# Patient Record
Sex: Female | Born: 1935 | Race: White | Hispanic: No | Smoking: Never smoker
Health system: Southern US, Community
[De-identification: ages and names within clinical notes are randomized; demographics above are authoritative.]

## PROBLEM LIST (undated history)

## (undated) DIAGNOSIS — E119 Type 2 diabetes mellitus without complications: Secondary | ICD-10-CM

## (undated) DIAGNOSIS — L570 Actinic keratosis: Secondary | ICD-10-CM

## (undated) DIAGNOSIS — M797 Fibromyalgia: Secondary | ICD-10-CM

## (undated) DIAGNOSIS — N289 Disorder of kidney and ureter, unspecified: Secondary | ICD-10-CM

## (undated) DIAGNOSIS — Z8739 Personal history of other diseases of the musculoskeletal system and connective tissue: Secondary | ICD-10-CM

## (undated) DIAGNOSIS — G4700 Insomnia, unspecified: Secondary | ICD-10-CM

## (undated) DIAGNOSIS — R911 Solitary pulmonary nodule: Secondary | ICD-10-CM

## (undated) DIAGNOSIS — K219 Gastro-esophageal reflux disease without esophagitis: Secondary | ICD-10-CM

## (undated) DIAGNOSIS — M1990 Unspecified osteoarthritis, unspecified site: Secondary | ICD-10-CM

## (undated) DIAGNOSIS — K5792 Diverticulitis of intestine, part unspecified, without perforation or abscess without bleeding: Secondary | ICD-10-CM

## (undated) DIAGNOSIS — F32A Depression, unspecified: Secondary | ICD-10-CM

## (undated) DIAGNOSIS — F329 Major depressive disorder, single episode, unspecified: Secondary | ICD-10-CM

## (undated) DIAGNOSIS — K5790 Diverticulosis of intestine, part unspecified, without perforation or abscess without bleeding: Secondary | ICD-10-CM

## (undated) DIAGNOSIS — I10 Essential (primary) hypertension: Secondary | ICD-10-CM

## (undated) HISTORY — PX: SHOULDER SURGERY: SHX246

## (undated) HISTORY — DX: Fibromyalgia: M79.7

## (undated) HISTORY — DX: Gastro-esophageal reflux disease without esophagitis: K21.9

## (undated) HISTORY — PX: THYROID SURGERY: SHX805

## (undated) HISTORY — PX: APPENDECTOMY: SHX54

## (undated) HISTORY — DX: Diverticulitis of intestine, part unspecified, without perforation or abscess without bleeding: K57.92

## (undated) HISTORY — DX: Diverticulosis of intestine, part unspecified, without perforation or abscess without bleeding: K57.90

## (undated) HISTORY — DX: Insomnia, unspecified: G47.00

## (undated) HISTORY — DX: Solitary pulmonary nodule: R91.1

## (undated) HISTORY — DX: Actinic keratosis: L57.0

## (undated) HISTORY — PX: FOOT NEUROMA SURGERY: SHX646

## (undated) HISTORY — PX: CHOLECYSTECTOMY: SHX55

## (undated) HISTORY — DX: Personal history of other diseases of the musculoskeletal system and connective tissue: Z87.39

## (undated) HISTORY — PX: ABDOMINAL HYSTERECTOMY: SHX81

## (undated) HISTORY — PX: EYE SURGERY: SHX253

## (undated) HISTORY — PX: TONSILLECTOMY: SUR1361

## (undated) HISTORY — PX: BACK SURGERY: SHX140

## (undated) HISTORY — PX: KNEE SURGERY: SHX244

## (undated) HISTORY — DX: Unspecified osteoarthritis, unspecified site: M19.90

## (undated) HISTORY — DX: Type 2 diabetes mellitus without complications: E11.9

---

## 1998-10-06 ENCOUNTER — Other Ambulatory Visit: Admission: RE | Admit: 1998-10-06 | Discharge: 1998-11-06 | Payer: Self-pay | Admitting: Gynecology

## 1999-09-26 ENCOUNTER — Encounter: Admission: RE | Admit: 1999-09-26 | Discharge: 1999-09-26 | Payer: Self-pay | Admitting: Gynecology

## 1999-09-26 ENCOUNTER — Encounter: Payer: Self-pay | Admitting: Gynecology

## 1999-10-05 ENCOUNTER — Encounter: Payer: Self-pay | Admitting: Gynecology

## 1999-10-05 ENCOUNTER — Encounter: Admission: RE | Admit: 1999-10-05 | Discharge: 1999-10-05 | Payer: Self-pay | Admitting: Gynecology

## 1999-10-11 ENCOUNTER — Other Ambulatory Visit: Admission: RE | Admit: 1999-10-11 | Discharge: 1999-10-11 | Payer: Self-pay | Admitting: Gynecology

## 1999-11-01 ENCOUNTER — Ambulatory Visit (HOSPITAL_COMMUNITY): Admission: RE | Admit: 1999-11-01 | Discharge: 1999-11-01 | Payer: Self-pay | Admitting: Surgery

## 1999-11-01 ENCOUNTER — Encounter (INDEPENDENT_AMBULATORY_CARE_PROVIDER_SITE_OTHER): Payer: Self-pay

## 1999-11-01 ENCOUNTER — Encounter: Payer: Self-pay | Admitting: Surgery

## 1999-12-03 ENCOUNTER — Emergency Department (HOSPITAL_COMMUNITY): Admission: EM | Admit: 1999-12-03 | Discharge: 1999-12-03 | Payer: Self-pay | Admitting: Emergency Medicine

## 2000-02-08 ENCOUNTER — Encounter: Admission: RE | Admit: 2000-02-08 | Discharge: 2000-02-08 | Payer: Self-pay | Admitting: Neurosurgery

## 2000-02-08 ENCOUNTER — Encounter: Payer: Self-pay | Admitting: Neurosurgery

## 2000-04-16 ENCOUNTER — Encounter: Payer: Self-pay | Admitting: Neurosurgery

## 2000-04-16 ENCOUNTER — Ambulatory Visit (HOSPITAL_COMMUNITY): Admission: RE | Admit: 2000-04-16 | Discharge: 2000-04-16 | Payer: Self-pay | Admitting: Neurosurgery

## 2000-05-01 ENCOUNTER — Ambulatory Visit (HOSPITAL_COMMUNITY): Admission: RE | Admit: 2000-05-01 | Discharge: 2000-05-01 | Payer: Self-pay | Admitting: Neurosurgery

## 2000-05-01 ENCOUNTER — Encounter: Payer: Self-pay | Admitting: Neurosurgery

## 2000-08-21 ENCOUNTER — Encounter: Payer: Self-pay | Admitting: Specialist

## 2000-08-26 ENCOUNTER — Inpatient Hospital Stay (HOSPITAL_COMMUNITY): Admission: RE | Admit: 2000-08-26 | Discharge: 2000-08-28 | Payer: Self-pay | Admitting: Specialist

## 2000-12-05 ENCOUNTER — Other Ambulatory Visit: Admission: RE | Admit: 2000-12-05 | Discharge: 2000-12-05 | Payer: Self-pay | Admitting: Gynecology

## 2000-12-16 ENCOUNTER — Encounter: Payer: Self-pay | Admitting: Gynecology

## 2000-12-16 ENCOUNTER — Encounter: Admission: RE | Admit: 2000-12-16 | Discharge: 2000-12-16 | Payer: Self-pay | Admitting: Gynecology

## 2001-02-03 ENCOUNTER — Encounter: Admission: RE | Admit: 2001-02-03 | Discharge: 2001-02-03 | Payer: Self-pay | Admitting: Neurosurgery

## 2001-02-03 ENCOUNTER — Encounter: Payer: Self-pay | Admitting: Neurosurgery

## 2002-01-05 ENCOUNTER — Encounter: Payer: Self-pay | Admitting: Gynecology

## 2002-01-05 ENCOUNTER — Encounter: Admission: RE | Admit: 2002-01-05 | Discharge: 2002-01-05 | Payer: Self-pay | Admitting: Gynecology

## 2002-02-04 ENCOUNTER — Other Ambulatory Visit: Admission: RE | Admit: 2002-02-04 | Discharge: 2002-02-04 | Payer: Self-pay | Admitting: Gynecology

## 2002-10-27 ENCOUNTER — Encounter: Payer: Self-pay | Admitting: Pulmonary Disease

## 2002-10-27 ENCOUNTER — Ambulatory Visit (HOSPITAL_COMMUNITY): Admission: RE | Admit: 2002-10-27 | Discharge: 2002-10-27 | Payer: Self-pay | Admitting: Adult Health

## 2004-02-04 ENCOUNTER — Encounter: Admission: RE | Admit: 2004-02-04 | Discharge: 2004-02-04 | Payer: Self-pay | Admitting: Gynecology

## 2004-02-14 ENCOUNTER — Other Ambulatory Visit: Admission: RE | Admit: 2004-02-14 | Discharge: 2004-02-14 | Payer: Self-pay | Admitting: Gynecology

## 2004-03-21 ENCOUNTER — Ambulatory Visit (HOSPITAL_COMMUNITY): Admission: RE | Admit: 2004-03-21 | Discharge: 2004-03-21 | Payer: Self-pay | Admitting: Neurosurgery

## 2004-05-23 ENCOUNTER — Encounter: Admission: RE | Admit: 2004-05-23 | Discharge: 2004-05-23 | Payer: Self-pay | Admitting: Gynecology

## 2004-05-29 ENCOUNTER — Encounter: Admission: RE | Admit: 2004-05-29 | Discharge: 2004-05-29 | Payer: Self-pay | Admitting: Gynecology

## 2004-07-04 ENCOUNTER — Encounter: Admission: RE | Admit: 2004-07-04 | Discharge: 2004-07-04 | Payer: Self-pay | Admitting: Surgery

## 2004-07-05 ENCOUNTER — Encounter (INDEPENDENT_AMBULATORY_CARE_PROVIDER_SITE_OTHER): Payer: Self-pay | Admitting: *Deleted

## 2004-07-05 ENCOUNTER — Ambulatory Visit (HOSPITAL_BASED_OUTPATIENT_CLINIC_OR_DEPARTMENT_OTHER): Admission: RE | Admit: 2004-07-05 | Discharge: 2004-07-05 | Payer: Self-pay | Admitting: Surgery

## 2004-07-05 ENCOUNTER — Ambulatory Visit (HOSPITAL_COMMUNITY): Admission: RE | Admit: 2004-07-05 | Discharge: 2004-07-05 | Payer: Self-pay | Admitting: Surgery

## 2004-10-31 ENCOUNTER — Encounter (INDEPENDENT_AMBULATORY_CARE_PROVIDER_SITE_OTHER): Payer: Self-pay | Admitting: *Deleted

## 2004-10-31 ENCOUNTER — Inpatient Hospital Stay (HOSPITAL_COMMUNITY): Admission: RE | Admit: 2004-10-31 | Discharge: 2004-11-04 | Payer: Self-pay | Admitting: Specialist

## 2004-11-04 ENCOUNTER — Inpatient Hospital Stay
Admission: RE | Admit: 2004-11-04 | Discharge: 2004-11-09 | Payer: Self-pay | Admitting: Physical Medicine & Rehabilitation

## 2004-11-04 ENCOUNTER — Ambulatory Visit: Payer: Self-pay | Admitting: Physical Medicine & Rehabilitation

## 2005-01-13 ENCOUNTER — Emergency Department (HOSPITAL_COMMUNITY): Admission: EM | Admit: 2005-01-13 | Discharge: 2005-01-13 | Payer: Self-pay | Admitting: Emergency Medicine

## 2005-01-20 IMAGING — CR DG CHEST 2V
2 series · 2 of 2 positions shown · non-contrast
Comparison: None.

CLINICAL DATA: Preoperative respiratory evaluation. Nipple discharge.

CHEST - 2 VIEW

[view not recorded (1 of 2)]
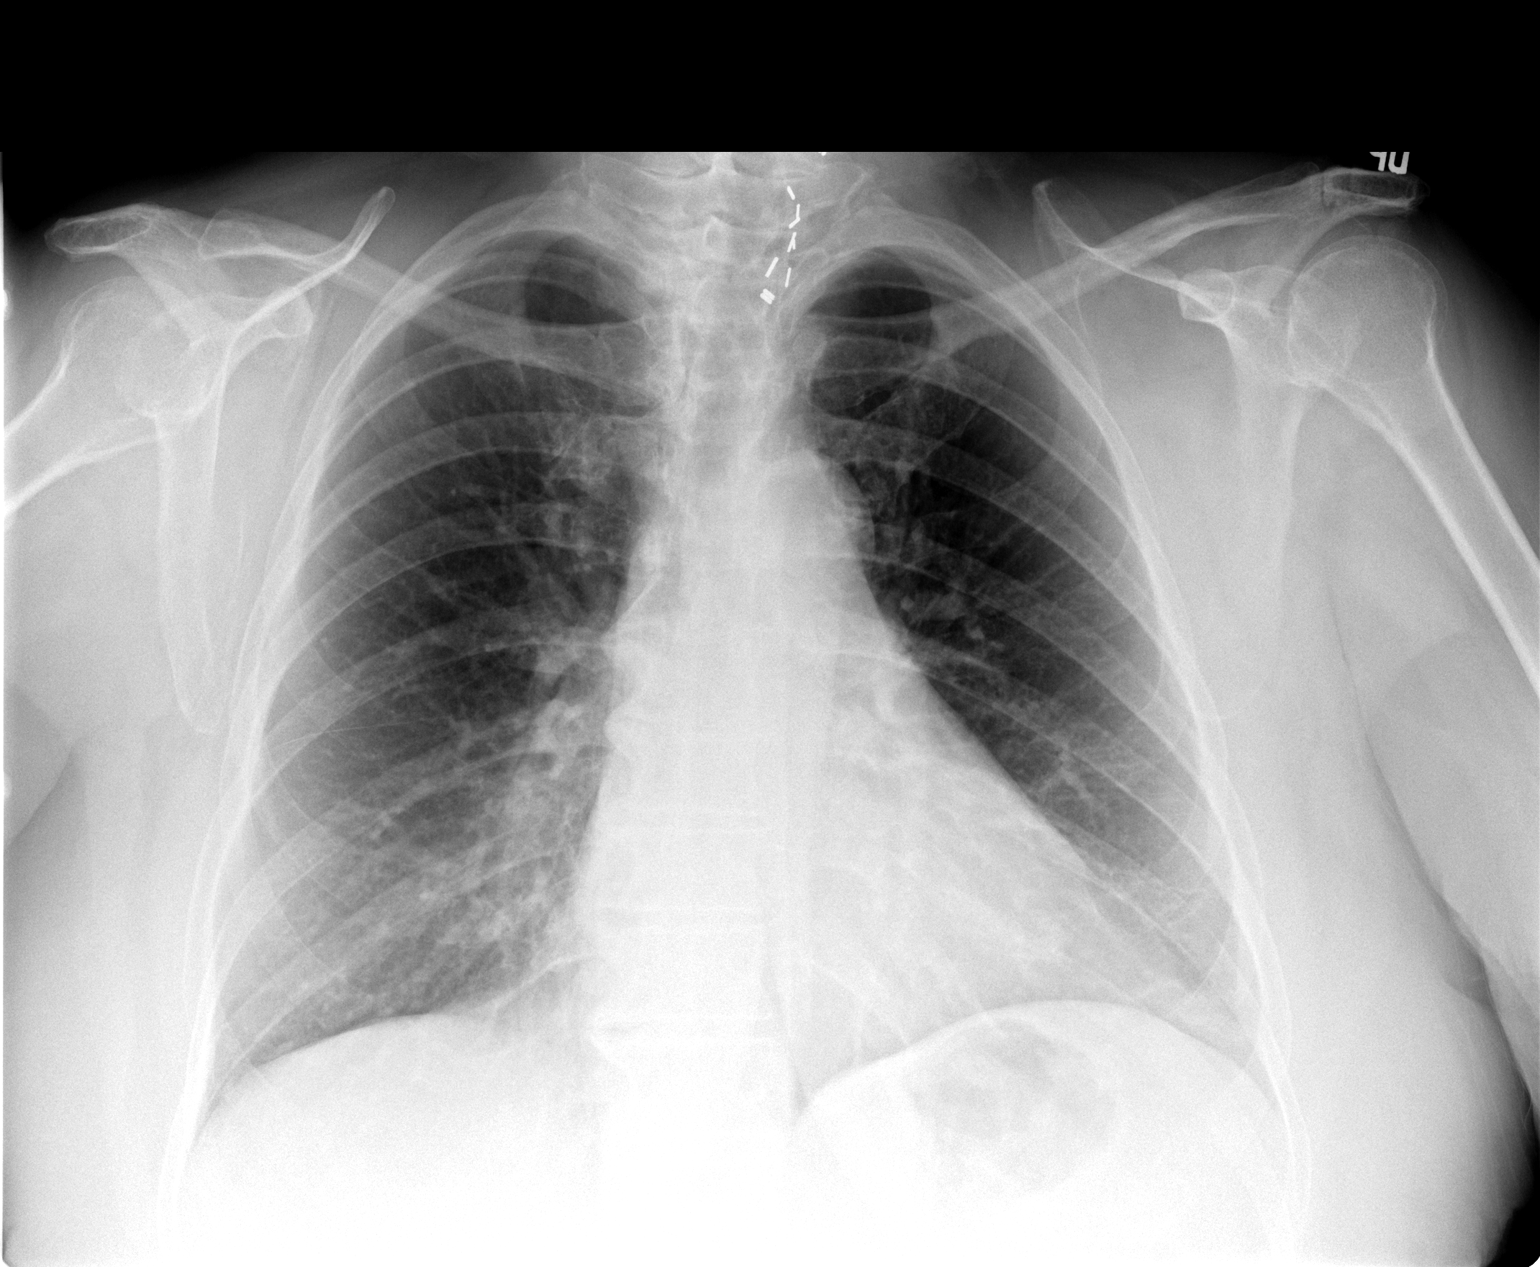

[view not recorded (2 of 2)]
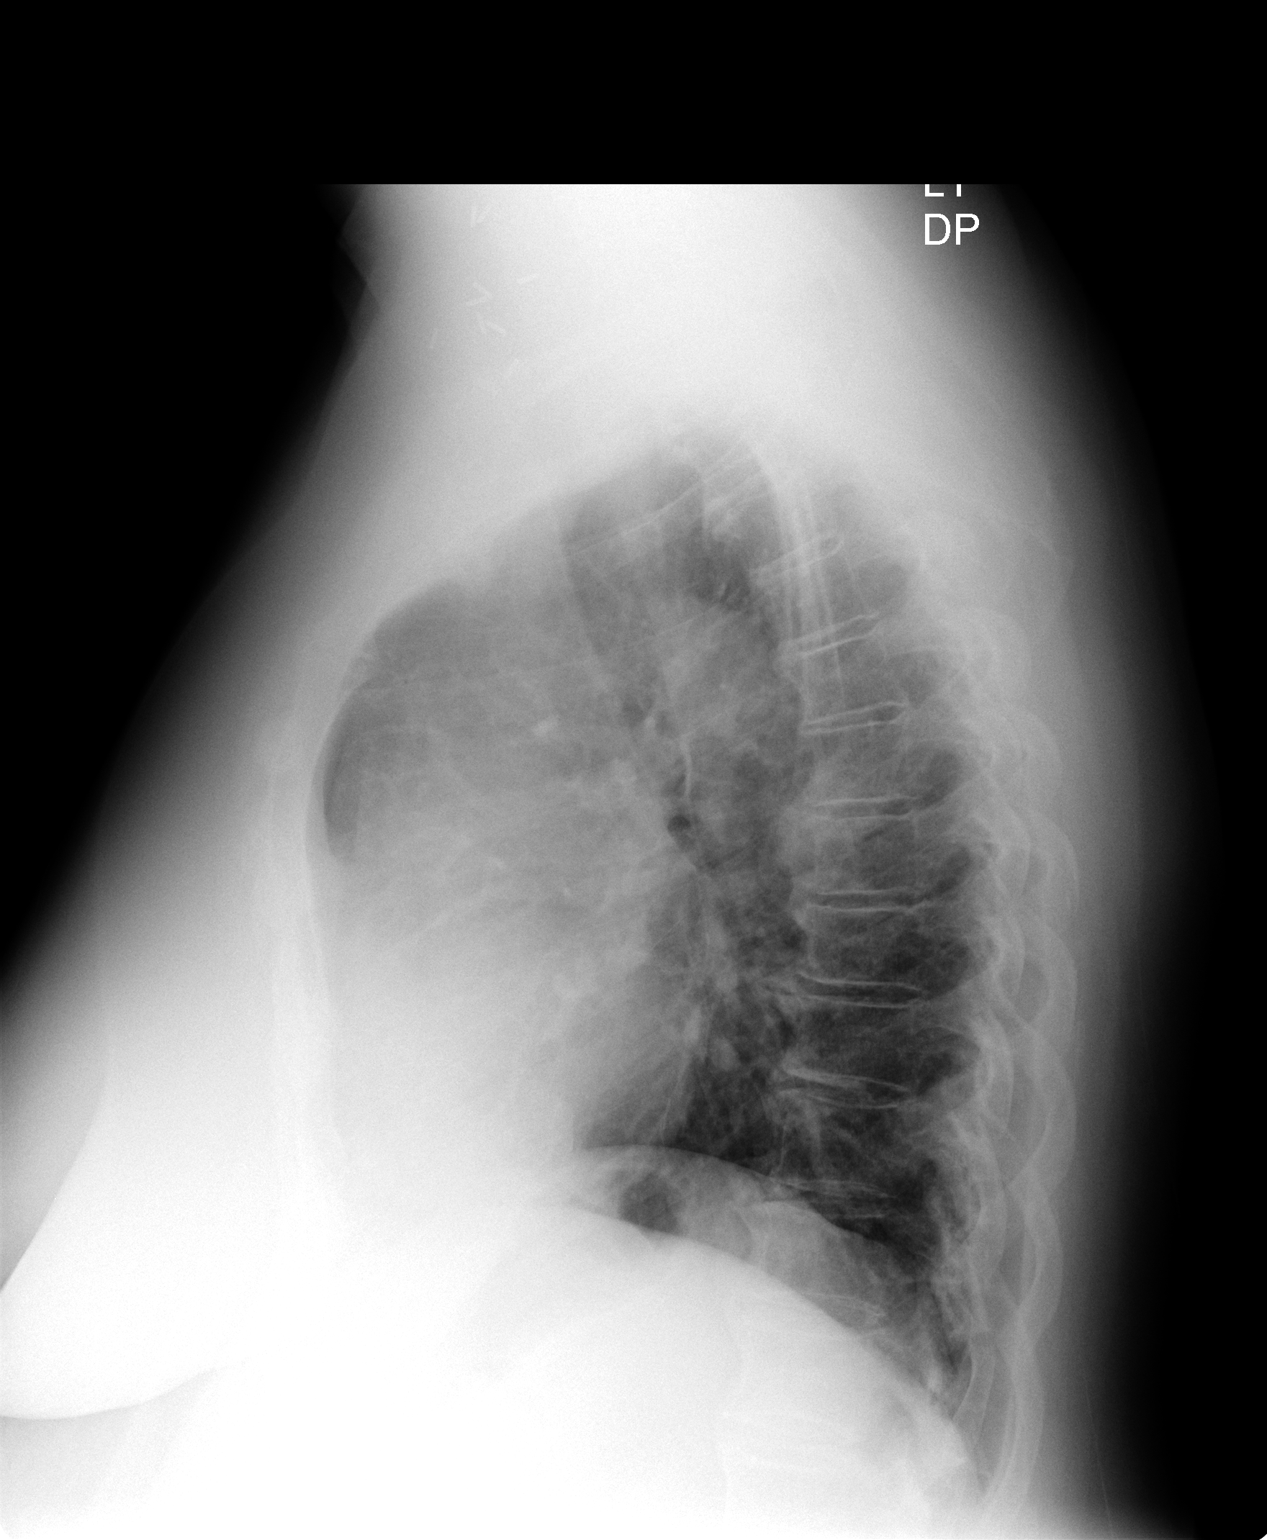

[2 of 2 positions shown; findings below may reference images not displayed]

FINDINGS: The cardiomediastinal silhouette is unremarkable for age. The lungs appear clear.
Degenerative changes are present in the thoracic spine. Surgical clips are noted in the left side
of the neck.

IMPRESSION

No evidence of acute disease.

## 2005-02-13 ENCOUNTER — Encounter: Admission: RE | Admit: 2005-02-13 | Discharge: 2005-02-13 | Payer: Self-pay | Admitting: Gynecology

## 2005-02-19 ENCOUNTER — Other Ambulatory Visit: Admission: RE | Admit: 2005-02-19 | Discharge: 2005-02-19 | Payer: Self-pay | Admitting: Gynecology

## 2005-02-22 ENCOUNTER — Encounter: Admission: RE | Admit: 2005-02-22 | Discharge: 2005-02-22 | Payer: Self-pay | Admitting: Gynecology

## 2005-08-16 ENCOUNTER — Encounter: Admission: RE | Admit: 2005-08-16 | Discharge: 2005-08-16 | Payer: Self-pay | Admitting: Gynecology

## 2005-11-13 ENCOUNTER — Ambulatory Visit: Payer: Self-pay | Admitting: Gastroenterology

## 2005-11-14 ENCOUNTER — Ambulatory Visit: Payer: Self-pay | Admitting: Gastroenterology

## 2005-11-14 ENCOUNTER — Encounter (INDEPENDENT_AMBULATORY_CARE_PROVIDER_SITE_OTHER): Payer: Self-pay | Admitting: *Deleted

## 2006-04-04 ENCOUNTER — Encounter: Admission: RE | Admit: 2006-04-04 | Discharge: 2006-04-04 | Payer: Self-pay | Admitting: Gynecology

## 2007-04-25 ENCOUNTER — Encounter: Admission: RE | Admit: 2007-04-25 | Discharge: 2007-04-25 | Payer: Self-pay | Admitting: Gynecology

## 2007-04-29 ENCOUNTER — Other Ambulatory Visit: Admission: RE | Admit: 2007-04-29 | Discharge: 2007-04-29 | Payer: Self-pay | Admitting: Gynecology

## 2007-08-10 ENCOUNTER — Emergency Department (HOSPITAL_COMMUNITY): Admission: EM | Admit: 2007-08-10 | Discharge: 2007-08-10 | Payer: Self-pay | Admitting: Family Medicine

## 2008-05-20 ENCOUNTER — Encounter: Admission: RE | Admit: 2008-05-20 | Discharge: 2008-05-20 | Payer: Self-pay | Admitting: Gynecology

## 2009-06-03 ENCOUNTER — Encounter: Admission: RE | Admit: 2009-06-03 | Discharge: 2009-06-03 | Payer: Self-pay | Admitting: Gynecology

## 2009-08-01 ENCOUNTER — Encounter: Admission: RE | Admit: 2009-08-01 | Discharge: 2009-08-01 | Payer: Self-pay | Admitting: Internal Medicine

## 2009-11-01 ENCOUNTER — Telehealth: Payer: Self-pay | Admitting: Gastroenterology

## 2009-11-02 DIAGNOSIS — I10 Essential (primary) hypertension: Secondary | ICD-10-CM | POA: Insufficient documentation

## 2009-11-02 DIAGNOSIS — F329 Major depressive disorder, single episode, unspecified: Secondary | ICD-10-CM

## 2009-11-02 DIAGNOSIS — F3289 Other specified depressive episodes: Secondary | ICD-10-CM | POA: Insufficient documentation

## 2009-11-02 DIAGNOSIS — K5732 Diverticulitis of large intestine without perforation or abscess without bleeding: Secondary | ICD-10-CM

## 2009-11-02 DIAGNOSIS — Z8711 Personal history of peptic ulcer disease: Secondary | ICD-10-CM

## 2009-11-02 DIAGNOSIS — K589 Irritable bowel syndrome without diarrhea: Secondary | ICD-10-CM

## 2009-11-02 DIAGNOSIS — E785 Hyperlipidemia, unspecified: Secondary | ICD-10-CM

## 2009-11-02 DIAGNOSIS — K219 Gastro-esophageal reflux disease without esophagitis: Secondary | ICD-10-CM | POA: Insufficient documentation

## 2009-11-03 ENCOUNTER — Ambulatory Visit: Payer: Self-pay | Admitting: Gastroenterology

## 2009-11-03 ENCOUNTER — Encounter (INDEPENDENT_AMBULATORY_CARE_PROVIDER_SITE_OTHER): Payer: Self-pay | Admitting: *Deleted

## 2009-11-03 DIAGNOSIS — R1032 Left lower quadrant pain: Secondary | ICD-10-CM

## 2009-11-03 DIAGNOSIS — R195 Other fecal abnormalities: Secondary | ICD-10-CM

## 2009-11-03 LAB — CONVERTED CEMR LAB
ALT: 27 units/L (ref 0–35)
Alkaline Phosphatase: 73 units/L (ref 39–117)
Basophils Absolute: 0.1 10*3/uL (ref 0.0–0.1)
CRP, High Sensitivity: 65 — ABNORMAL HIGH (ref 0.00–5.00)
Calcium: 9.9 mg/dL (ref 8.4–10.5)
Chloride: 102 meq/L (ref 96–112)
Creatinine, Ser: 0.8 mg/dL (ref 0.4–1.2)
Eosinophils Absolute: 0.1 10*3/uL (ref 0.0–0.7)
GFR calc non Af Amer: 73.29 mL/min (ref >60)
HCT: 43.1 % (ref 36.0–46.0)
Hemoglobin: 13.9 g/dL (ref 12.0–15.0)
Lymphocytes Relative: 29.1 % (ref 12.0–46.0)
MCHC: 32.3 g/dL (ref 30.0–36.0)
MCV: 97.5 fL (ref 78.0–100.0)
Neutro Abs: 2.9 10*3/uL (ref 1.4–7.7)
Platelets: 210 10*3/uL (ref 150.0–400.0)
RBC: 4.42 M/uL (ref 3.87–5.11)
Sed Rate: 63 mm/hr — ABNORMAL HIGH (ref 0–22)
Sodium: 138 meq/L (ref 135–145)
Total Bilirubin: 0.5 mg/dL (ref 0.3–1.2)
Total Protein: 8.1 g/dL (ref 6.0–8.3)
Transferrin: 220.3 mg/dL (ref 212.0–360.0)
Vitamin B-12: 383 pg/mL (ref 211–911)

## 2009-11-09 ENCOUNTER — Ambulatory Visit: Payer: Self-pay | Admitting: Gastroenterology

## 2009-11-24 ENCOUNTER — Ambulatory Visit: Payer: Self-pay | Admitting: Gastroenterology

## 2009-12-26 ENCOUNTER — Ambulatory Visit: Payer: Self-pay | Admitting: Gastroenterology

## 2009-12-27 ENCOUNTER — Telehealth: Payer: Self-pay | Admitting: Gastroenterology

## 2009-12-27 ENCOUNTER — Encounter: Payer: Self-pay | Admitting: Gastroenterology

## 2009-12-27 LAB — CONVERTED CEMR LAB
Basophils Absolute: 0.1 10*3/uL (ref 0.0–0.1)
Basophils Relative: 0.8 % (ref 0.0–3.0)
Eosinophils Absolute: 0.1 10*3/uL (ref 0.0–0.7)
Eosinophils Relative: 0.9 % (ref 0.0–5.0)
HCT: 41.2 % (ref 36.0–46.0)
Hemoglobin: 13.5 g/dL (ref 12.0–15.0)
Lymphs Abs: 1.6 10*3/uL (ref 0.7–4.0)
MCV: 96.9 fL (ref 78.0–100.0)
Neutro Abs: 7.8 10*3/uL — ABNORMAL HIGH (ref 1.4–7.7)
WBC: 10.5 10*3/uL (ref 4.5–10.5)

## 2010-01-03 ENCOUNTER — Ambulatory Visit: Payer: Self-pay | Admitting: Gastroenterology

## 2010-01-03 DIAGNOSIS — K559 Vascular disorder of intestine, unspecified: Secondary | ICD-10-CM | POA: Insufficient documentation

## 2010-01-03 LAB — CONVERTED CEMR LAB
ALT: 28 units/L (ref 0–35)
Basophils Absolute: 0 10*3/uL (ref 0.0–0.1)
Creatinine, Ser: 0.6 mg/dL (ref 0.4–1.2)
Eosinophils Absolute: 0.1 10*3/uL (ref 0.0–0.7)
Folate: 19.4 ng/mL
GFR calc non Af Amer: 102.11 mL/min (ref >60)
HCT: 41 % (ref 36.0–46.0)
Hemoglobin: 13.4 g/dL (ref 12.0–15.0)
Lymphs Abs: 1.4 10*3/uL (ref 0.7–4.0)
MCHC: 32.5 g/dL (ref 30.0–36.0)
Monocytes Absolute: 0.4 10*3/uL (ref 0.1–1.0)
Neutro Abs: 4.3 10*3/uL (ref 1.4–7.7)
Potassium: 4.2 meq/L (ref 3.5–5.1)
RBC: 4.23 M/uL (ref 3.87–5.11)
TSH: 1.18 microintl units/mL (ref 0.35–5.50)
Total Bilirubin: 0.4 mg/dL (ref 0.3–1.2)
Transferrin: 196.9 mg/dL — ABNORMAL LOW (ref 212.0–360.0)
Vitamin B-12: 428 pg/mL (ref 211–911)
WBC: 6.2 10*3/uL (ref 4.5–10.5)

## 2010-01-24 ENCOUNTER — Ambulatory Visit: Payer: Self-pay | Admitting: Gastroenterology

## 2010-01-24 DIAGNOSIS — K5289 Other specified noninfective gastroenteritis and colitis: Secondary | ICD-10-CM

## 2010-02-20 ENCOUNTER — Telehealth: Payer: Self-pay | Admitting: Gastroenterology

## 2010-03-23 ENCOUNTER — Encounter: Admission: RE | Admit: 2010-03-23 | Discharge: 2010-03-23 | Payer: Self-pay | Admitting: Internal Medicine

## 2010-08-08 ENCOUNTER — Encounter: Admission: RE | Admit: 2010-08-08 | Discharge: 2010-08-08 | Payer: Self-pay | Admitting: Internal Medicine

## 2010-10-30 ENCOUNTER — Encounter: Payer: Self-pay | Admitting: Gynecology

## 2010-11-07 NOTE — Procedures (Signed)
Summary: Colon   Colonoscopy  Procedure date:  11/14/2005  Findings:      Location:  [facility].    Colonoscopy  Procedure date:  11/14/2005  Findings:      Location:  [facility].   Patient Name: Kristen Wagner, Naff MRN:  Procedure Procedures: Colonoscopy CPT: [id number].    with biopsy. CPT: Q5068410.  Personnel: Endoscopist: Vania Rea. Jarold Motto, MD.  Exam Location: Exam performed in Outpatient Clinic. Outpatient  Patient Consent: Procedure, Alternatives, Risks and Benefits discussed, consent obtained, from patient. Consent was obtained by the RN.  Indications Symptoms: Hematochezia.  History  Current Medications: Patient is not currently taking Coumadin.  Pre-Exam Physical: Performed Nov 14, 2005. Cardio-pulmonary exam, Rectal exam, Abdominal exam, Extremity exam, Mental status exam WNL.  Comments: Pt. history reviewed/updated, physical exam performed prior to initiation of sedation? Exam Exam: Extent of exam reached: Cecum, extent intended: Cecum.  The cecum was identified by appendiceal orifice and IC valve. Patient position: on left side. Duration of exam: 20 minutes. Colon retroflexion performed. Images taken. ASA Classification: II. Tolerance: excellent.  Monitoring: Pulse and BP monitoring, Oximetry used. Supplemental O2 given. at 2 Liters.  Colon Prep Used Golytely for colon prep. Prep results: excellent.  Sedation Meds: Patient assessed and found to be appropriate for moderate (conscious) sedation. Fentanyl 75 mcg. given IV. Versed 6 mg. given IV.  Instrument(s): CF 140L. Serial [id number].  Findings ISCHEMIC COLITIS: Transverse Colon to Descending Colon. Erythema present. Vascular pattern absent. Erosions absent, Pseudo polyps absent, Granularity present, bleeding absent, ulcers absent, Haustral folds diminished. Activity level inactive, Biopsy/Mucosal Abn. taken. ICD9: Colitis, Ischemic: 557.9. Comments: Scarring present  but no activity...  - DIVERTICULOSIS: Descending Colon to Sigmoid Colon. Not bleeding. ICD9: Diverticulosis, Colon: 562.10.  - NORMAL EXAM: Cecum to Rectum. Not Seen: Polyps. AVM's. Colitis. Tumors. Crohn's. Diverticulosis.   Assessment  Diagnoses: 557.9: Colitis, Ischemic.  562.10: Diverticulosis, Colon.   Events  Unplanned Interventions: No intervention was required.  Plans Medication Plan: Await pathology. Continue current medications.  Patient Education: Patient given standard instructions for: Diverticulosis.  Disposition: After procedure patient sent to recovery. After recovery patient sent home.  Scheduling/Referral: Follow-Up prn. Await pathology to schedule patient.    cc: Saul Fordyce ANP  This report was created from the original endoscopy report, which was reviewed and signed by the above listed endoscopist.

## 2010-11-07 NOTE — Assessment & Plan Note (Signed)
Summary: 2 WEEK F/U   History of Present Illness Visit Type: Follow-up Visit Primary GI MD: Sheryn Bison MD FACP FAGA Primary Provider: Baltazar Najjar, MD Requesting Provider: n/a Chief Complaint: Two week f/u for diarrhea.  Pt states that the diarrhea is better and she denies any GI complaints  History of Present Illness:   Her chronic diarrhea is completely resolved on Entocort 9 mg a day. She denies other gastrointestinal issues. She been unable to afford her medication and this is been supplied by some samples that we provided. She is on multiple other medications listed and reviewed her chart today. She continues to complain of chronic insomnia and apparently her insurance covered it will not pay for her Ambien CR 6.25 mg a day.   GI Review of Systems      Denies abdominal pain, acid reflux, belching, bloating, chest pain, dysphagia with liquids, dysphagia with solids, heartburn, loss of appetite, nausea, vomiting, vomiting blood, weight loss, and  weight gain.        Denies anal fissure, black tarry stools, change in bowel habit, constipation, diarrhea, diverticulosis, fecal incontinence, heme positive stool, hemorrhoids, irritable bowel syndrome, jaundice, light color stool, liver problems, rectal bleeding, and  rectal pain.    Current Medications (verified): 1)  Nexium 40 Mg Cpdr (Esomeprazole Magnesium) .... One Tablet By Mouth Once Daily 2)  Amlodipine Besylate 5 Mg Tabs (Amlodipine Besylate) .... One Tablet By Mouth Once Daily 3)  Quinapril Hcl 20 Mg Tabs (Quinapril Hcl) .... One Tablet By Mouth Once Daily 4)  Fexofenadine Hcl 60 Mg Tabs (Fexofenadine Hcl) .... One Tablet By Mouth Once Daily 5)  Ambien Cr 6.25 Mg Cr-Tabs (Zolpidem Tartrate) .... One Tablet By Mouth At Bedtime 6)  Flax Seed Oil 1000 Mg Caps (Flaxseed (Linseed)) .... 2 Capsules By Mouth Once Daily 7)  Biotin 1000 Mcg Tabs (Biotin) .... One Tablet By Mouth Once Daily 8)  Vitamin D3 5000 Unit/ml Liqd  (Cholecalciferol) .... One Tablet By Mouth Once Daily 9)  Ester-C 500-60 Mg Tabs (Vitamin Mixture) .... One Tablet By Mouth Once Daily 10)  Multivitamins   Tabs (Multiple Vitamin) .... One Tablet By Mouth Once Daily 11)  Calcium-Magnesium-Zinc 333-133-8.3 Mg Tabs (Calcium-Magnesium-Zinc) .... One Tablet By Mouth Once Daily 12)  Tramadol Hcl 50 Mg Tabs (Tramadol Hcl) .Marland Kitchen.. 1 By Mouth Q 6 Hrs As Needed Pain 13)  Lexapro 10 Mg Tabs (Escitalopram Oxalate) .... Once Daily 14)  Entocort Ec 3 Mg  Cp24 (Budesonide) .... Take 3 Capsules Daily  Allergies (verified): 1)  ! Prednisone  Past History:  Past medical, surgical, family and social histories (including risk factors) reviewed for relevance to current acute and chronic problems.  Past Medical History: Reviewed history from 11/02/2009 and no changes required. Current Problems:  ACID REFLUX DISEASE (ICD-530.81) GASTRIC ULCER, HX OF (ICD-V12.71) DEPRESSION (ICD-311) HYPERLIPIDEMIA (ICD-272.4) HYPERTENSION (ICD-401.9) DIVERTICULITIS, COLON (ICD-562.11) IRRITABLE BOWEL SYNDROME (ICD-564.1)  Past Surgical History: Reviewed history from 11/02/2009 and no changes required. Appendectomy Breast Biopsy Cholecystectomy Hysterectomy  Family History: Reviewed history from 11/03/2009 and no changes required. No FH of Colon Cancer: Family History of Diabetes: Mother  Social History: Reviewed history from 11/03/2009 and no changes required. Widowed Patient has never smoked.  Alcohol Use - yes Illicit Drug Use - no  Review of Systems  The patient denies allergy/sinus, anemia, anxiety-new, arthritis/joint pain, back pain, blood in urine, breast changes/lumps, change in vision, confusion, cough, coughing up blood, depression-new, fainting, fatigue, fever, headaches-new, hearing problems, heart murmur, heart rhythm  changes, itching, menstrual pain, muscle pains/cramps, night sweats, nosebleeds, pregnancy symptoms, shortness of breath, skin rash,  sleeping problems, sore throat, swelling of feet/legs, swollen lymph glands, thirst - excessive , urination - excessive , urination changes/pain, urine leakage, vision changes, and voice change.    Vital Signs:  Patient profile:   77 year old female Height:      64 inches Weight:      181 pounds BMI:     31.18 Pulse rate:   70 / minute Pulse rhythm:   regular BP sitting:   144 / 76  (right arm) Cuff size:   regular  Vitals Entered By: Christie Nottingham CMA Duncan Dull) (January 24, 2010 3:13 PM)  Physical Exam  General:  Well developed, well nourished, no acute distress.healthy appearing.   Head:  Normocephalic and atraumatic. Eyes:  PERRLA, no icterus.exam deferred to patient's ophthalmologist.   Psych:  Alert and cooperative. Normal mood and affect.   Impression & Recommendations:  Problem # 1:  OTH&UNSPEC NONINFECTIOUS GASTROENTERITIS&COLITIS (ICD-558.9) Assessment Improved Therapeutic options are limited her repeated refusal to get medications because of expense. I have given her enough Entocort to taper off of this over the next 2 weeks. She has a relapse, this issue will be readdressed.  Problem # 2:  DEPRESSION (ICD-311) Assessment: Improved Trial of regular generic Ambien 10 mg at bedtime as tolerated. In the future this will need to be refilled by her primary care physician. It is of note she takes Lexapro 10 mg a day.  Patient Instructions: 1)  Decrease entocort to two tabs daily for one week.  Then take on tab daily for one week. Then stop entocort. 2)  Call Dr. Jarold Motto if your symptoms return. 3)  Begin Ambien for sleep. 4)  The medication list was reviewed and reconciled.  All changed / newly prescribed medications were explained.  A complete medication list was provided to the patient / caregiver. 5)  Copy sent to : Dr. Kirtland Bouchard  Appended Document: 2 WEEK F/U    Clinical Lists Changes  Medications: Changed medication from ENTOCORT EC 3 MG  CP24 (BUDESONIDE)  Take 3 capsules daily to ENTOCORT EC 3 MG  CP24 (BUDESONIDE) Decrease to 2 tabs daily for one week then 1 tab daily for one week then d/c. Changed medication from AMBIEN CR 6.25 MG CR-TABS (ZOLPIDEM TARTRATE) one tablet by mouth at bedtime to AMBIEN 10 MG TABS (ZOLPIDEM TARTRATE) 1 q hs as needed - Signed Rx of AMBIEN 10 MG TABS (ZOLPIDEM TARTRATE) 1 q hs as needed;  #30 x 0;  Signed;  Entered by: Ashok Cordia RN;  Authorized by: Mardella Layman MD [facility];  Method used: Printed then faxed to [facility]*, [address], [place], [place]  [address], Ph: [phone] or [phone], Fax: [phone]    Prescriptions: AMBIEN 10 MG TABS (ZOLPIDEM TARTRATE) 1 q hs as needed  #30 x 0   Entered by:   Ashok Cordia RN   Authorized by:   Mardella Layman MD [facility]   Signed by:   Ashok Cordia RN on 01/24/2010   Method used:   Printed then faxed to ...       [facility]* (retail)       [address] [place], [place]  [address]       Ph: [phone]  or [phone]       Fax: [phone]   RxID:   [id number]

## 2010-11-07 NOTE — Progress Notes (Signed)
Summary: Lialda not covered  Phone Note Outgoing Call   Summary of Call: Insurance has denied prior authorization for lialda. Pt needs to try all othe covered meds such as: Asacol, sulfasalazine, balasalazide and pentasa.  OR the prescriber must provide specific medical reasons why these meds are not appropriate to treat pt.  Pt is willing to try another med if indicated. Initial call taken by: Ashok Cordia RN,  December 27, 2009 3:46 PM  Follow-up for Phone Call        asacol 800 mg two times a day is ok Follow-up by: Mardella Layman MD [name],  December 27, 2009 4:01 PM  Additional Follow-up for Phone Call Additional follow up Details #1::        Pt notified.  Rx sent to pharmacy. Additional Follow-up by: Ashok Cordia RN,  December 28, 2009 9:09 AM    New/Updated Medications: ASACOL HD 800 MG TBEC (MESALAMINE) 1 by mouth two times a day Prescriptions: ASACOL HD 800 MG TBEC (MESALAMINE) 1 by mouth two times a day  #60 x 6   Entered by:   Ashok Cordia RN   Authorized by:   Mardella Layman MD [facility]   Signed by:   Ashok Cordia RN on 12/28/2009   Method used:   Electronically to        [facility]* (retail)       [address] [place], [place]  [address]       Ph: [phone] or [phone]       Fax: [phone]   RxID:   [id number]

## 2010-11-07 NOTE — Miscellaneous (Signed)
Summary: Samples of Lialda  Clinical Lists Changes  Medications: Changed medication from LIALDA 1.2 GM  TBEC (MESALAMINE) 2 tabs by mouth qd to LIALDA 1.2 GM  TBEC (MESALAMINE) 2 tabs by mouth qd

## 2010-11-07 NOTE — Assessment & Plan Note (Signed)
Summary: F/U APPT...LSW.   History of Present Illness Visit Type: Follow-up Visit Primary GI MD: Sheryn Bison MD FACP FAGA Primary Provider: Baltazar Najjar, MD Requesting Provider: n/a Chief Complaint: F/u for IBS and Diverticulitis. 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 However, she had not responded to p.o.amino salicylate therapy and continued abdominal cramping and diarrhea. She takes daily Nexium for acid reflux the lower broad of other medications again listed and reviewed her chart. She denies systemic complaints except for diffuse arthralgias with suspected polymyalgia rheumatica with intermittent courses of prednisone therapy. She denies any food intolerances or use of p.o. sorbitol or fructose.   GI Review of Systems      Denies abdominal pain, acid reflux, belching, bloating, chest pain, dysphagia with liquids, dysphagia with solids, heartburn, loss of appetite, nausea, vomiting, vomiting blood, weight loss, and  weight gain.      Reports diarrhea, diverticulosis, and  irritable bowel syndrome.     Denies anal fissure, black tarry stools, change in bowel habit, constipation, fecal incontinence, heme positive stool, hemorrhoids, jaundice, light color stool, liver problems, rectal bleeding, and  rectal pain.    Current Medications (verified): 1)  Nexium 40 Mg Cpdr (Esomeprazole Magnesium) .... One Tablet By Mouth Once Daily 2)  Amlodipine Besylate 5 Mg Tabs (Amlodipine Besylate) .... One Tablet By Mouth Once Daily 3)  Quinapril Hcl 20 Mg Tabs (Quinapril Hcl) .... One Tablet By Mouth Once Daily 4)  Fexofenadine Hcl 60 Mg Tabs (Fexofenadine Hcl) .... One Tablet By Mouth Once Daily 5)  Ambien Cr 6.25 Mg Cr-Tabs (Zolpidem Tartrate) .... One Tablet By Mouth At Bedtime 6)   Flax Seed Oil 1000 Mg Caps (Flaxseed (Linseed)) .... 2 Capsules By Mouth Once Daily 7)  Biotin 1000 Mcg Tabs (Biotin) .... One Tablet By Mouth Once Daily 8)  Vitamin D3 5000 Unit/ml Liqd (Cholecalciferol) .... One Tablet By Mouth Once Daily 9)  Ester-C 500-60 Mg Tabs (Vitamin Mixture) .... One Tablet By Mouth Once Daily 10)  Multivitamins   Tabs (Multiple Vitamin) .... One Tablet By Mouth Once Daily 11)  Calcium-Magnesium-Zinc 333-133-8.3 Mg Tabs (Calcium-Magnesium-Zinc) .... One Tablet By Mouth Once Daily 12)  Asacol Hd 800 Mg Tbec (Mesalamine) .Marland Kitchen.. 1 By Mouth Two Times A Day 13)  Tramadol Hcl 50 Mg Tabs (Tramadol Hcl) .Marland Kitchen.. 1 By Mouth Q 6 Hrs As Needed Pain 14)  Lexapro 10 Mg Tabs (Escitalopram Oxalate) .... Once Daily  Allergies (verified): 1)  ! Prednisone  Past History:  Past medical, surgical, family and social histories (including risk factors) reviewed for relevance to current acute and chronic problems.  Past Medical History: Reviewed history from 11/02/2009 and no changes required. Current Problems:  ACID REFLUX DISEASE (ICD-530.81) GASTRIC ULCER, HX OF (ICD-V12.71) DEPRESSION (ICD-311) HYPERLIPIDEMIA (ICD-272.4) HYPERTENSION (ICD-401.9) DIVERTICULITIS, COLON (ICD-562.11) IRRITABLE BOWEL SYNDROME (ICD-564.1)  Past Surgical History: Reviewed history from 11/02/2009 and no changes required. Appendectomy Breast Biopsy Cholecystectomy Hysterectomy  Family History: Reviewed history from 11/03/2009 and no changes required. No FH of Colon Cancer: Family History of Diabetes: Mother  Social History: Reviewed history from 11/03/2009 and no changes required. Widowed Patient has never smoked.  Alcohol Use - yes Illicit Drug Use - no  Review of Systems       The patient complains of arthritis/joint pain, back pain, fatigue, and sleeping problems.  The patient denies allergy/sinus, anemia, anxiety-new, blood in urine, breast changes/lumps, change in vision, confusion,  cough, coughing up blood, depression-new, fainting, fever, headaches-new, hearing problems, heart murmur, heart rhythm changes, itching, menstrual pain, muscle pains/cramps, night sweats, nosebleeds, pregnancy symptoms, shortness of breath, skin rash, sore throat, swelling of feet/legs, swollen lymph glands, thirst - excessive , urination - excessive , urination changes/pain, urine leakage, vision changes, and voice change.    Vital Signs:  Patient profile:   77 year old female Height:      64 inches Weight:      182 pounds BMI:     31.35 BSA:     1.88 Pulse rate:   72 / minute Pulse rhythm:   regular BP sitting:   136 / 82  (left arm) Cuff size:   regular  Vitals Entered By: Ok Anis CMA (January 03, 2010 1:26 PM)  Physical Exam  General:  Well developed, well nourished, no acute distress.healthy appearing and obese.   Head:  Normocephalic and atraumatic. Eyes:  PERRLA, no icterus.exam deferred to patient's ophthalmologist.   Abdomen:  Soft, nontender and nondistended. No masses, hepatosplenomegaly or hernias noted. Normal bowel sounds. Neurologic:  Alert and  oriented x4;  grossly normal neurologically. Psych:  Alert and cooperative. Normal mood and affect.   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 I have decided to treat her with Entocort 9 mg a day with office followup in several weeks' time. P.o.amino salicylates have been discontinued. I also placed her on low fiber diet as tolerated and will repeat her labs. Orders: TLB-BMP (Basic Metabolic Panel-BMET) (80048-METABOL) TLB-CBC Platelet - w/Differential (85025-CBCD) TLB-Hepatic/Liver Function Pnl (80076-HEPATIC) TLB-TSH (Thyroid Stimulating Hormone) (84443-TSH) TLB-B12, Serum-Total ONLY (13086-V78) TLB-Ferritin (82728-FER) TLB-Folic Acid (Folate) (82746-FOL) TLB-IBC Pnl  (Iron/FE;Transferrin) (83550-IBC) TLB-Magnesium (Mg) (83735-MG) TLB-IgA (Immunoglobulin A) (82784-IGA) T-Sprue Panel (Celiac Disease Aby Eval) (83516x3/[address])  Problem # 2:  ABDOMINAL PAIN, LEFT LOWER QUADRANT (ICD-789.04) Assessment: Unchanged  Problem # 3:  GASTRIC ULCER, HX OF (ICD-V12.71) Assessment: Improved Continued daily PPI therapy.  Patient Instructions: 1)  Begin Entocort 3 tabs each morning.  2)  Stop Lialda. 3)  Go to  the basement for lab work. 4)  Please schedule a follow-up appointment in 2 weeks.  5)  The medication list was reviewed and reconciled.  All changed / newly prescribed medications were explained.  A complete medication list was provided to the patient / caregiver. 6)  Copy sent to : Dr. Baltazar Najjar 7)  Please schedule a follow-up appointment in 2 weeks.  8)  Advised to stick with a low residue diet  avoiding food that can irritate bowel (see handout).

## 2010-11-07 NOTE — Letter (Signed)
Summary: [facility]  [address] [place], [place] [address]   Phone: [phone]  Fax: [phone]       ANDRE GALLEGO    10-Dec-1926    MRN: [id number]        Procedure Day Dorna Bloom: Wednesday, 11/09/09     Arrival Time: 3:00      Procedure Time: 4:00     Location of Procedure:                    _X _  [facility] (4th Floor)    PREPARATION FOR FLEXIBLE SIGMOIDOSCOPY WITH MAGNESIUM CITRATE  Prior to the day before your procedure, purchase one 8 oz. bottle of Magnesium Citrate and one Fleet Enema from the laxative section of your drugstore.  _________________________________________________________________________________________________  THE DAY BEFORE YOUR PROCEDURE             DATE: 11/08/09     DAY: Tuesday  1.   Have a clear liquid dinner the night before your procedure.  2.   Do not drink anything colored red or purple.  Avoid juices with pulp.  No orange juice.              CLEAR LIQUIDS INCLUDE: Water Jello Ice Popsicles Tea (sugar ok, no milk/cream) Powdered fruit flavored drinks Coffee (sugar ok, no milk/cream) Gatorade Juice: apple, white grape, white cranberry  Lemonade Clear bullion, consomm, broth Carbonated beverages (any kind) Strained chicken noodle soup Hard Candy   3.   At 7:00 pm the night before your procedure, drink one bottle of Magnesium Citrate over ice.  4.   Drink at least 3 more glasses of clear liquids before bedtime (preferably juices).  5.   Results are expected usually within 1 to 6 hours after taking the Magnesium Citrate.  ___________________________________________________________________________________________________  THE DAY OF YOUR PROCEDURE            DATE: 11/09/09     DAY: Wednesday  1.   Use Fleet Enema one hour prior to coming for procedure.  2.   You may drink clear liquids until 2:00 (2 hours before exam)       MEDICATION INSTRUCTIONS  Unless otherwise  instructed, you should take regular prescription medications with a small sip of water as early as possible the morning of your procedure.                OTHER INSTRUCTIONS  You will need a responsible adult at least 75 years of age to accompany you and drive you home.   This person must remain in the waiting room during your procedure.  Wear loose fitting clothing that is easily removed.  Leave jewelry and other valuables at home.  However, you may wish to bring a book to read or an iPod/MP3 player to listen to music as you wait for your procedure to start.  Remove all body piercing jewelry and leave at home.  Total time from sign-in until discharge is approximately 2-3 hours.  You should go home directly after your procedure and rest.  You can resume normal activities the day after your procedure.  The day of your procedure you should not:   Drive   Make legal decisions   Operate machinery   Drink alcohol   Return to work  You will receive specific instructions about eating, activities and medications before you leave.   The above instructions have been reviewed and explained to me by  _______________________    I fully understand and can verbalize these instructions _____________________________ Date _________

## 2010-11-07 NOTE — Procedures (Signed)
Summary: Flexible Sigmoidoscopy  Patient: [name] Note: All result statuses are Final unless otherwise noted.  Tests: (1) Flexible Sigmoidoscopy (FLX)  FLX Flexible Sigmoidoscopy                             DONE     [facility]     [address]     [place], [place]  [address]           FLEXIBLE SIGMOIDOSCOPY PROCEDURE REPORT           PATIENT:  Kristen Wagner, Kristen Wagner  MR#:  [id number]     BIRTHDATE:  [birth year], 82 yrs. old  GENDER:  female           ENDOSCOPIST:  Vania Rea. Jarold Motto, MD, [facility]     Referred by:           PROCEDURE DATE:  11/09/2009     PROCEDURE:  Flexible Sigmoidoscopy, diagnostic     ASA CLASS:  Class II     INDICATIONS:  abdominal pain HX. OF ?? ISCHEMIC COLITIS.           MEDICATIONS:   Fentanyl 50 mcg IV, Versed 4 mg IV           DESCRIPTION OF PROCEDURE:   After the risks benefits and     alternatives of the procedure were thoroughly explained, informed     consent was obtained.  Digital rectal exam was performed and     revealed no abnormalities.   The [id number] endoscope     was introduced through the anus and advanced to the splenic     flexure, limited by a tortuous and redundant colon, poor     preparation.   The quality of the prep was poor.  The instrument     was then slowly withdrawn as the mucosa was fully examined.     <<PROCEDUREIMAGES>>           Severe diverticulosis was found in the sigmoid colon. MILD COLITIS     WITH GRANULARITY AND EDEMA AND REDNESS.NO EROSIONS OR     STRICTURING.SEVERE LARGE TICS NOTED.   Retroflexed views in the     rectum revealed medium hemorrhoids.    The scope was then     withdrawn from the patient and the procedure terminated.           COMPLICATIONS:  None           ENDOSCOPIC IMPRESSION:     1) Severe diverticulosis in the sigmoid colon     2) Medium hemorrhoids     SEGMENTAL COLITIS ACCOCIATED WITH CHRONIC DIVERTICULOSIS,,,     RECOMMENDATIONS:     1) follow-up: office 2  week(s)     2) fiber rich diet     CONTINUE LIALDA 2.4 DAILY,PO BENEFIBER AND LIBERAL PO FLUIDS.           REPEAT EXAM:  No           ______________________________     Vania Rea. Jarold Motto, MD, [name]           CC:  Baltazar Najjar, MD           n.     Rosalie Doctor:   Vania Rea. Ameirah Khatoon at 11/09/2009 04:02 PM           [name], [phone]  Note: An exclamation mark (!) indicates a result that was not dispersed into the flowsheet.  Document Creation Date: 11/09/2009 4:03 PM _______________________________________________________________________  (1) Order result status: Final Collection or observation date-time: 11/09/2009 15:53 Requested date-time:  Receipt date-time:  Reported date-time:  Referring Physician:   Ordering Physician: Sheryn Bison [phone]) Specimen Source:  Source: [name] Order Number: [id number] Lab site:

## 2010-11-07 NOTE — Progress Notes (Signed)
Summary: problems refilling rx  Phone Note Call from Patient Call back at Home Phone [phone]   Caller: Patient Call For: Jarold Motto Reason for Call: Talk to Nurse Summary of Call: Patient states that she is having problems refilling her rx (lialda) Initial call taken by: Tawni Levy,  December 27, 2009 2:29 PM  Follow-up for Phone Call        Prior authorization in process.  Awaiting fax from ins co.  Pt notified.  Samples given. #2 boxes  Follow-up by: Ashok Cordia RN,  December 27, 2009 2:49 PM

## 2010-11-07 NOTE — Assessment & Plan Note (Signed)
Summary: 2 wk follow up/dfs   History of Present Illness Visit Type: Follow-up Visit Primary GI MD: Sheryn Bison MD FACP FAGA Primary Provider: Baltazar Najjar, MD Chief Complaint: Patient has improved; Theodosia Blender is working History of Present Illness:   She had flexible sigmoidoscopy showed severe diverticulosis and segmental colitis. She currently is asymptomatic on Lialda 2.4 g a day. Lab review is otherwise unremarkable except for sed rate of 68. Her associated arthralgias also had markedly improved. She denies use of NSAIDs otherwise.    GI Review of Systems      Denies abdominal pain, acid reflux, belching, bloating, chest pain, dysphagia with liquids, dysphagia with solids, heartburn, loss of appetite, nausea, vomiting, vomiting blood, weight loss, and  weight gain.        Denies anal fissure, black tarry stools, change in bowel habit, constipation, diarrhea, diverticulosis, fecal incontinence, heme positive stool, hemorrhoids, irritable bowel syndrome, jaundice, light color stool, liver problems, rectal bleeding, and  rectal pain.    Current Medications (verified): 1)  Nexium 40 Mg Cpdr (Esomeprazole Magnesium) .... One Tablet By Mouth Once Daily 2)  Amlodipine Besylate 5 Mg Tabs (Amlodipine Besylate) .... One Tablet By Mouth Once Daily 3)  Quinapril Hcl 20 Mg Tabs (Quinapril Hcl) .... One Tablet By Mouth Once Daily 4)  Fexofenadine Hcl 60 Mg Tabs (Fexofenadine Hcl) .... One Tablet By Mouth Once Daily 5)  Ambien Cr 6.25 Mg Cr-Tabs (Zolpidem Tartrate) .... One Tablet By Mouth At Bedtime 6)  Flax Seed Oil 1000 Mg Caps (Flaxseed (Linseed)) .... 2 Capsules By Mouth Once Daily 7)  Biotin 1000 Mcg Tabs (Biotin) .... One Tablet By Mouth Once Daily 8)  Vitamin D3 5000 Unit/ml Liqd (Cholecalciferol) .... One Tablet By Mouth Once Daily 9)  Ester-C 500-60 Mg Tabs (Vitamin Mixture) .... One Tablet By Mouth Once Daily 10)  Multivitamins   Tabs (Multiple Vitamin) .... One Tablet By Mouth Once  Daily 11)  Calcium-Magnesium-Zinc 333-133-8.3 Mg Tabs (Calcium-Magnesium-Zinc) .... One Tablet By Mouth Once Daily 12)  Lialda 1.2 Gm  Tbec (Mesalamine) .... 2 Tabs By Mouth Qd 13)  Tramadol Hcl 50 Mg Tabs (Tramadol Hcl) .Marland Kitchen.. 1 By Mouth Q 6 Hrs As Needed Pain 14)  Lexapro 10 Mg Tabs (Escitalopram Oxalate) .... Once Daily  Allergies (verified): No Known Drug Allergies  Past History:  Past medical, surgical, family and social histories (including risk factors) reviewed for relevance to current acute and chronic problems.  Past Medical History: Reviewed history from 11/02/2009 and no changes required. Current Problems:  ACID REFLUX DISEASE (ICD-530.81) GASTRIC ULCER, HX OF (ICD-V12.71) DEPRESSION (ICD-311) HYPERLIPIDEMIA (ICD-272.4) HYPERTENSION (ICD-401.9) DIVERTICULITIS, COLON (ICD-562.11) IRRITABLE BOWEL SYNDROME (ICD-564.1)  Past Surgical History: Reviewed history from 11/02/2009 and no changes required. Appendectomy Breast Biopsy Cholecystectomy Hysterectomy  Family History: Reviewed history from 11/03/2009 and no changes required. No FH of Colon Cancer: Family History of Diabetes: Mother  Social History: Reviewed history from 11/03/2009 and no changes required. Widowed Patient has never smoked.  Alcohol Use - yes Illicit Drug Use - no  Review of Systems       The patient complains of allergy/sinus, arthritis/joint pain, back pain, depression-new, fatigue, headaches-new, muscle pains/cramps, and sleeping problems.  The patient denies anemia, anxiety-new, blood in urine, breast changes/lumps, change in vision, confusion, cough, coughing up blood, fainting, fever, hearing problems, heart murmur, heart rhythm changes, itching, menstrual pain, night sweats, nosebleeds, pregnancy symptoms, shortness of breath, skin rash, sore throat, swelling of feet/legs, swollen lymph glands, thirst - excessive , urination -  excessive , urination changes/pain, urine leakage, vision  changes, and voice change.    Vital Signs:  Patient profile:   77 year old female Height:      64 inches Weight:      187.50 pounds BMI:     32.30 Pulse rate:   60 / minute Pulse rhythm:   regular BP sitting:   148 / 90  (left arm) Cuff size:   regular  Vitals Entered By: June McMurray CMA Duncan Dull) (November 24, 2009 9:43 AM)  Physical Exam  General:  Well developed, well nourished, no acute distress.healthy appearing.   Head:  Normocephalic and atraumatic. Eyes:  PERRLA, no icterus.exam deferred to patient's ophthalmologist.   Psych:  Alert and cooperative. Normal mood and affect.   Impression & Recommendations:  Problem # 1:  ABDOMINAL PAIN, LEFT LOWER QUADRANT (ICD-789.04) Assessment Improved Continue Lialda 2.4 g a day for segmental colitis with office visit in 6 weeks and repeat sedimentation rate in one month.  Problem # 2:  ACID REFLUX DISEASE (ICD-530.81) Assessment: Improved Continue reflect regime and daily Nexium therapy.  Problem # 3:  DEPRESSION (ICD-311) Assessment: Improved  Problem # 4:  HYPERTENSION (ICD-401.9) Assessment: Improved  Patient Instructions: 1)  Copy sent to : Dr. Baltazar Najjar 2)  Please continue current medications.  3)  Please schedule a follow-up appointment in 6 to 8 weeks.  4)  Please return in one month for lab work. 5)  The medication list was reviewed and reconciled.  All changed / newly prescribed medications were explained.  A complete medication list was provided to the patient / caregiver.

## 2010-11-07 NOTE — Procedures (Signed)
Summary: EGD   EGD  Procedure date:  11/14/2005  Findings:      Location: [facility]    EGD  Procedure date:  11/14/2005  Findings:      Location: [facility]   Patient Name: Kristen Wagner, Kristen Wagner MRN:  Procedure Procedures: Panendoscopy (EGD) CPT: 43235.    with biopsy(s)/brushing(s). CPT: D1846139.    with esophageal dilation. CPT: G9296129.  Personnel: Endoscopist: Vania Rea. Jarold Motto, MD.  Exam Location: Exam performed in Outpatient Clinic. Outpatient  Patient Consent: Procedure, Alternatives, Risks and Benefits discussed, consent obtained, from patient. Consent was obtained by the RN.  Indications Symptoms: Reflux symptoms  History  Current Medications: Patient is not currently taking Coumadin.  Pre-Exam Physical: Performed Nov 14, 2005  Cardio-pulmonary exam, Abdominal exam, Extremity exam, Mental status exam WNL.  Comments: Pt. history reviewed/updated, physical exam performed prior to initiation of sedation? Exam Exam Info: Maximum depth of insertion Duodenum, intended Duodenum. Patient position: on left side. Duration of exam: 15 minutes. Vocal cords visualized. Gastric retroflexion performed. Images taken. ASA Classification: II. Tolerance: excellent.  Sedation Meds: Patient assessed and found to be appropriate for moderate (conscious) sedation. Cetacaine Spray 1 sprays given aerosolized. Versed 2 mg. given IV. Fentanyl 25 mcg. given IV.  Monitoring: BP and pulse monitoring done. Oximetry used. Supplemental O2 given at 2 Liters.  Instrument(s): GIF 160. Serial [id number].   Findings - Normal: Proximal Esophagus to Distal Esophagus. Not Seen: Tumor. Barrett's esophagus. Esophageal inflammation. Mucosal abnormality. Stricture. Varices.  - STRICTURE / STENOSIS: Distal Esophagus.  Constriction: partial. Lumen diameter is 14 mm. ICD9: Esophageal Stricture: 530.3.  - Dilation: Distal Esophagus. Maloney dilator used, Diameter: 58  F, Minimal Resistance, No Heme present on extraction. 1  total dilators used. Patient tolerance excellent. Outcome: successful.  - Normal: Fundus to Antrum. Ulcer. Mucosal abnormality. AVM's. Foreign body. Polyp. Varices.  - Normal: Pyloric Sphincter to Duodenal 2nd Portion. Ulcer. Mucosal abnormality. AVM's. Foreign body. Biopsy/Normal taken. Comments: R/O celiac disease...   Assessment  Diagnoses: 530.3: Esophageal Stricture. GERD.   Events  Unplanned Intervention: No unplanned interventions were required.  Plans Medication(s): Await pathology. Continue current medications.  Patient Education: Patient given standard instructions for: Reflux.  Disposition: After procedure patient sent to recovery. After recovery patient sent home.  Scheduling: Await pathology to schedule patient. Follow-up prn.    ZO:XWRUE virgil, ANP  This report was created from the original endoscopy report, which was reviewed and signed by the above listed endoscopist.

## 2010-11-07 NOTE — Progress Notes (Signed)
Summary: triage  Phone Note Call from Patient Call back at Home Phone [phone]   Caller: Patient Call For: Dr. Jarold Motto Reason for Call: Talk to Nurse Summary of Call: pt reports "excruciating" pain in the "pit of her abd"... sitting or laying down doesnt help; this is a constant pain... bowel habits are normal... would like to know if she needs to come in to see Dr. Jarold Motto Initial call taken by: Vallarie Mare,  November 01, 2009 11:22 AM  Follow-up for Phone Call        talked with pt.  States she has been having spells of lower abd pain since Christmas.  Change in bowel habits.  Appt sch for 11/03/09. Follow-up by: Ashok Cordia RN,  November 01, 2009 11:48 AM

## 2010-11-07 NOTE — Assessment & Plan Note (Signed)
Summary: Abd pain, change in bowel habits/dfs   History of Present Illness Visit Type: Follow-up Visit Primary GI MD: Sheryn Bison MD FACP FAGA Primary Provider: Baltazar Najjar, MD Chief Complaint: Lower intermiitant abd pain x 2 months. Pt states her bowel habits have changed at this same time. Pt states they have become more frequent and smaller stools. History of Present Illness:   Elderly 77 year old white female with a past history of diverticulosis and suspected ischemic colitis now presents with a one-month history of suprapubic pain, intermittent diarrhea, with gas and bloody. She recently has completed a course of low dose corticosteroids by Dr. Roxan Hockey with suspected polymyalgia rheumatica. She has not been on recent antibiotics.  Patient has a long history of hypertension, hyperlipidemia, chronic GERD, IBS, osteoporosis, and chronic anxiety syndrome. Also in the past she has responded to treatment for bacterial overgrowth syndrome. She's had previous endoscopy with dilatation of an esophageal stricture in February 2007. She continues on therapy with Nexium 40 mg a day. Last colonoscopy in February 2007 showed findings consistent with chronic left-sided ischemic colitis. She has had previous pelvic operations and has suspected pelvic adhesions. Surgeries have included cholecystectomy, appendectomy, and hysterectomy. Patient is not abusing NSAIDs or alcohol or cigarettes.  She denies current upper gastrointestinal or hepatobiliary problems. Her appetite is good her weight is been stable. Her medications are listed and reviewed today. Review of systems is negative for any active cardiovascular or pulmonary or genitourinary problems.   GI Review of Systems    Reports abdominal pain.     Location of  Abdominal pain: lwer.    Denies acid reflux, belching, bloating, chest pain, dysphagia with liquids, dysphagia with solids, heartburn, loss of appetite, nausea, vomiting, vomiting blood,  weight loss, and  weight gain.      Reports change in bowel habits.     Denies anal fissure, black tarry stools, constipation, diarrhea, diverticulosis, fecal incontinence, heme positive stool, hemorrhoids, irritable bowel syndrome, jaundice, light color stool, liver problems, rectal bleeding, and  rectal pain. Preventive Screening-Counseling & Management      Drug Use:  no.      Current Medications (verified): 1)  Nexium 40 Mg Cpdr (Esomeprazole Magnesium) .... One Tablet By Mouth Once Daily 2)  Amlodipine Besylate 5 Mg Tabs (Amlodipine Besylate) .... One Tablet By Mouth Once Daily 3)  Quinapril Hcl 20 Mg Tabs (Quinapril Hcl) .... One Tablet By Mouth Once Daily 4)  Fexofenadine Hcl 60 Mg Tabs (Fexofenadine Hcl) .... One Tablet By Mouth Once Daily 5)  Ambien Cr 6.25 Mg Cr-Tabs (Zolpidem Tartrate) .... One Tablet By Mouth At Bedtime 6)  Flax Seed Oil 1000 Mg Caps (Flaxseed (Linseed)) .... 2 Capsules By Mouth Once Daily 7)  Biotin 1000 Mcg Tabs (Biotin) .... One Tablet By Mouth Once Daily 8)  Vitamin D3 5000 Unit/ml Liqd (Cholecalciferol) .... One Tablet By Mouth Once Daily 9)  Ester-C 500-60 Mg Tabs (Vitamin Mixture) .... One Tablet By Mouth Once Daily 10)  Multivitamins   Tabs (Multiple Vitamin) .... One Tablet By Mouth Once Daily 11)  Calcium-Magnesium-Zinc 333-133-8.3 Mg Tabs (Calcium-Magnesium-Zinc) .... One Tablet By Mouth Once Daily  Allergies (verified): No Known Drug Allergies  Past History:  Past medical, surgical, family and social histories (including risk factors) reviewed for relevance to current acute and chronic problems.  Past Medical History: Reviewed history from 11/02/2009 and no changes required. Current Problems:  ACID REFLUX DISEASE (ICD-530.81) GASTRIC ULCER, HX OF (ICD-V12.71) DEPRESSION (ICD-311) HYPERLIPIDEMIA (ICD-272.4) HYPERTENSION (ICD-401.9) DIVERTICULITIS, COLON (  VOZ-366.44) IRRITABLE BOWEL SYNDROME (ICD-564.1)  Past Surgical History: Reviewed  history from 11/02/2009 and no changes required. Appendectomy Breast Biopsy Cholecystectomy Hysterectomy  Family History: Reviewed history and no changes required. No FH of Colon Cancer: Family History of Diabetes: Mother  Social History: Reviewed history from 11/02/2009 and no changes required. Widowed Patient has never smoked.  Alcohol Use - yes Illicit Drug Use - no Drug Use:  no  Review of Systems       The patient complains of arthritis/joint pain.  The patient denies allergy/sinus, anemia, anxiety-new, back pain, blood in urine, breast changes/lumps, change in vision, confusion, cough, coughing up blood, depression-new, fainting, fatigue, fever, headaches-new, hearing problems, heart murmur, heart rhythm changes, itching, menstrual pain, muscle pains/cramps, night sweats, nosebleeds, pregnancy symptoms, shortness of breath, skin rash, sleeping problems, sore throat, swelling of feet/legs, swollen lymph glands, thirst - excessive , urination - excessive , urination changes/pain, urine leakage, vision changes, and voice change.    Vital Signs:  Patient profile:   77 year old female Height:      64 inches Weight:      188.25 pounds BMI:     32.43 Pulse rate:   78 / minute Pulse rhythm:   regular BP sitting:   140 / 82  (left arm) Cuff size:   regular  Vitals Entered By: Christie Nottingham CMA Duncan Dull) (November 03, 2009 11:14 AM)  Physical Exam  General:  Well developed, well nourished, no acute distress.healthy appearing.   Head:  Normocephalic and atraumatic. Eyes:  PERRLA, no icterus.exam deferred to patient's ophthalmologist.   Lungs:  decreased BS on L and decreased BS on R.   Heart:  Regular rate and rhythm; no murmurs, rubs,  or bruits. Abdomen:  Soft, nontender and nondistended. No masses, hepatosplenomegaly or hernias noted. Normal bowel sounds. Rectal:  Normal exam.hemoccult positive.   Pulses:  Normal pulses noted. Extremities:  No clubbing, cyanosis, edema or  deformities noted. Neurologic:  Alert and  oriented x4;  grossly normal neurologically. Inguinal Nodes:  No significant inguinal adenopathy. Psych:  Alert and cooperative. Normal mood and affect.   Impression & Recommendations:  Problem # 1:  ABDOMINAL PAIN, LEFT LOWER QUADRANT (ICD-789.04) Abdominal exam today is fairly unremarkable. I suspect she either has chronic recurrent ischemic colitis versus segmental colitis associated with her chronic diverticulosis. I scheduled her for laboratory review , flexible sigmoidoscopy exam, have started her on Lialda 2.4 g a day with p.r.n. tramadol 50 mg every 6-8 hours and low fiber foods. She probably will need CT scan of her abdomen also with attention to her mesenteric vascular supply. Orders: TLB-CBC Platelet - w/Differential (85025-CBCD) TLB-BMP (Basic Metabolic Panel-BMET) (80048-METABOL) TLB-Hepatic/Liver Function Pnl (80076-HEPATIC) TLB-TSH (Thyroid Stimulating Hormone) (84443-TSH) TLB-B12, Serum-Total ONLY (03474-Q59) TLB-Ferritin (82728-FER) TLB-IBC Pnl (Iron/FE;Transferrin) (83550-IBC) TLB-CRP-High Sensitivity (C-Reactive Protein) (86140-FCRP) T- * Misc. Laboratory test [id number])  Problem # 2:  ACID REFLUX DISEASE (ICD-530.81) Assessment: Improved continue reflex regime and daily Nexium therapy.  Problem # 3:  DEPRESSION (ICD-311) Assessment: Improved She has no clinical depression at this time and is not on antidepressant therapy. The patient has a long history of fibromyalgia and recently completed a prednisone Dosepak.  Problem # 4:  HYPERTENSION (ICD-401.9) Assessment: Improved Blood Pressure Today is 140/82 and I've asked her to continue her blood pressure medications as listed and prescribed per Dr. Roxan Hockey  Patient Instructions: 1)  Copy sent to : Dr. Roxan Hockey at [facility] 2)  Please continue current medications.  3)  Labs  Pending 4)  Colonoscopy and Flexible Sigmoidoscopy brochure given.  5)  Conscious  Sedation brochure given.  6)  Lialda 2.4 g a day with p.r.n. tramadol 50 mg every 6-8 hours 7)  Advised to stick with a low residue diet  avoiding food that can irritate bowel (see handout).  8)  Consider abdominal CT scan 9)  The medication list was reviewed and reconciled.  All changed / newly prescribed medications were explained.  A complete medication list was provided to the patient / caregiver.   Appended Document: Abd pain, change in bowel habits/dfs    Clinical Lists Changes  Medications: Added new medication of LIALDA 1.2 GM  TBEC (MESALAMINE) 2 tabs by mouth qd - Signed Added new medication of TRAMADOL HCL 50 MG TABS (TRAMADOL HCL) 1 by mouth q 6 hrs as needed pain - Signed Rx of LIALDA 1.2 GM  TBEC (MESALAMINE) 2 tabs by mouth qd;  #60 x 6;  Signed;  Entered by: Ashok Cordia RN;  Authorized by: Mardella Layman MD [facility];  Method used: Electronically to [facility]*, [address], [place], [place]  [address], Ph: [phone] or [phone], Fax: [phone] Rx of TRAMADOL HCL 50 MG TABS (TRAMADOL HCL) 1 by mouth q 6 hrs as needed pain;  #60 x 1;  Signed;  Entered by: Ashok Cordia RN;  Authorized by: Mardella Layman MD [facility];  Method used: Electronically to [facility]*, [address], [place], [place]  [address], Ph: [phone] or [phone], Fax: [phone] Orders: Added new Test order of Flex with Sedation (Flex w/Sed) - Signed    Prescriptions: TRAMADOL HCL 50 MG TABS (TRAMADOL HCL) 1 by mouth q 6 hrs as needed pain  #60 x 1   Entered by:   Ashok Cordia RN   Authorized by:   Mardella Layman MD [facility]   Signed by:   Ashok Cordia RN on 11/03/2009   Method used:   Electronically to        [facility]* (retail)       [address] [place], [place]  [address]       Ph: [phone] or [phone]        Fax: [phone]   RxID:   [id number] LIALDA 1.2 GM  TBEC (MESALAMINE) 2 tabs by mouth qd  #60 x 6   Entered by:   Ashok Cordia RN   Authorized by:   Mardella Layman MD [facility]   Signed by:   Ashok Cordia RN on 11/03/2009   Method used:   Electronically to        [facility]* (retail)       [address] [place], [place]  [address]       Ph: [phone] or [phone]       Fax: [phone]   RxID:   [id number]

## 2010-11-07 NOTE — Progress Notes (Signed)
Summary: triage  Phone Note Call from Patient Call back at Home Phone [phone]   Caller: Patient Call For: Jarold Motto Reason for Call: Talk to Nurse Summary of Call: Patient has diverticulities flare up. Initial call taken by: Tawni Levy,  Feb 20, 2010 2:03 PM  Follow-up for Phone Call        Pt states she has had a couple spells of dairrhea since here for last OV.  She has finished the entocort as instructed.  Sat pm, pt had dairrhea all night long and also passed some blood.  This was the worse spell she has had.  Feeling some better today but eating lite.  Pt perfers not to go back on entocort if there is an alternative.  It caused headaches.   Follow-up by: Ashok Cordia RN,  Feb 20, 2010 2:21 PM  Additional Follow-up for Phone Call Additional follow up Details #1::        SHE NEEDS TO MAKE A DECISION ABOUT HER MEDS..WE ARE HER DOCTORS,NOT SOCIAL WORKERS..IF SHE CANNOT AFFOR...ENTOCORT IS THE rX FOR THIS CONDITIOND MEDS, SHE NEEDS TO :A.APPLY FOR MEDICADE B.GO TO [facility].ENTOCORT IS THE TREATMENT FOR THIS CONDITION.OTHER MEDS NOT EFFECTIVE AND MULTIPLE SIDE EFFECTS... Additional Follow-up by: Mardella Layman MD FACG,  Feb 20, 2010 2:46 PM    Additional Follow-up for Phone Call Additional follow up Details #2::    Talked with pt.   Since she is better today, she is going to watch her diet and see how she does.   Pt instructed to call if she has a spell that last more than a day or symptoms become severe. Follow-up by: Ashok Cordia RN,  Feb 20, 2010 3:36 PM

## 2010-11-07 NOTE — Medication Information (Signed)
Summary: Lialda DENIED/[employer]  Lialda DENIED/[employer]   Imported By: Sherian Rein 01/06/2010 08:28:21  _____________________________________________________________________  External Attachment:    Type:   Image     Comment:   External Document

## 2011-02-23 NOTE — Op Note (Signed)
NAMEHENYA, Kristen Wagner             ACCOUNT NO.:  [id number]   MEDICAL RECORD NO.:  [id number]          PATIENT TYPE:  AMB   LOCATION:  DSC                          FACILITY:  [facility]   PHYSICIAN:  Currie Paris, M.D.DATE OF BIRTH:  08-Aug-1927   DATE OF PROCEDURE:  07/05/2004  DATE OF DISCHARGE:                                 OPERATIVE REPORT   PREOPERATIVE DIAGNOSIS:  Probable intraductal papilloma, right breast.   POSTOPERATIVE DIAGNOSIS:  Probable intraductal papilloma, right breast.   OPERATION PERFORMED:  Ductal excision, right breast.   SURGEON:  Currie Paris, M.D.   ANESTHESIA:  General.   INDICATIONS FOR PROCEDURE:  The patient has had a clearish discharge from  duct at about the 8 o'clock position on the right.  A ductogram showed what  appeared to be a fairly superficially located intraductal papilloma or at  least a filling defect of some sort.  After discussion with the patient, she  elected to have excisional biopsy.   DESCRIPTION OF PROCEDURE:  The patient was seen in the holding areas and she  had no further questions.  The right breast was marked as the operative  site.  She was taken to the operating room and after satisfactory general  endotracheal anesthesia was obtained, the breast was prepped and draped.  I  attempted to cannulate the duct with a 25 gauge angiocath but this was too  flexible.  I was to able to get a tear duct probe into the duct easily and  then I tried again to cannulate it.  This time a slightly larger angiocath,  but again I could not get it to thread.  The tear duct probe was then  replaced into the duct.   At this point I made a curvilinear incision at the areolar margin and  elevated the subcutaneous tissue off of the areola until I could identify  the tear duct probe in the duct.  It was divided off of the undersurface of  the nipple and the duct tissue grasped with a hemostat and the tear duct  probe removed.  Using  cautery, I took a wide excision in a cylindrical form  around the duct.  Towards the deeper area, I saw a little reddish drainage  which I thought perhaps was a dilated duct with some blood in it.  The  drainage from nipple had, however, been clear.   I felt I was well around the area in question.  This was sent to pathology.  Bleeders were electrocoagulated. Marcaine was introduced to help with  postoperative pain relief.  The incision was closed with some 3-0 Vicryl  followed by 4-0 Monocryl subcuticular and Indermil.  The patient tolerated  the procedure well.  There were no operative complications.  All counts were  correct.       CJS/MEDQ  D:  07/05/2004  T:  07/05/2004  Job:  [id number]   cc:   Gretta Cool, M.D.  [address] [place]  [place] [address]  Fax: [phone]   [facility]

## 2011-02-23 NOTE — Op Note (Signed)
NAMESHAMIYA, DEMERITT NO.:  [id number]   MEDICAL RECORD NO.:  [id number]          PATIENT TYPE:  INP   LOCATION:  0006                         FACILITY:  [facility]   PHYSICIAN:  Erasmo Leventhal, M.D.DATE OF BIRTH:  [birth year]   DATE OF PROCEDURE:  10/31/2004  DATE OF DISCHARGE:                                 OPERATIVE REPORT   PREOPERATIVE DIAGNOSIS:  Left knee combined osteoarthritis, rheumatoid  arthritis.   POSTOPERATIVE DIAGNOSIS:  Left knee combined osteoarthritis, rheumatoid  arthritis.   PROCEDURE:  Left total knee arthroplasty, associated synovial biopsy.   SURGEON:  Erasmo Leventhal, M.D.   ASSISTANT:  Jaquelyn Bitter. Chabon, P.A.-C.   ANESTHESIA:  General with postop femoral nerve block.   ESTIMATED BLOOD LOSS:  Less than 100 cubic centimeters.   DRAINS:  Two medium Hemovac.   COMPLICATIONS:  None.   TOURNIQUET TIME:  One hour and 30 minutes at 350 mmHg.   DISPOSITION:  PACU stable.   OPERATIVE IMPLANTS:  Stryker Scorpion, a size 7 femur, a size 7 tibia, a 10  mm posterior stabilized flexed tibial insert, and a 26 mm all-polyethylene  patella, all cemented.   OPERATIVE DETAILS:  The patient was counseled in the holding area, the  correct side was identified and signed appropriately.  The chart was  reviewed and signed appropriately.  IV was started.  Antibiotics were given.  Taken to the OR, the spinal was administered and was unsuccessful, and she  was placed under general anesthesia per anesthesiologist.  She was placed  supine.  The Foley catheter was placed, utilizing a sterile technique by the  OR circulating nurse.  All extremities were well padded and bumped.  The  left knee was examined.  She had a five-degree flexion contracture.  She  could flex 125 degrees.  Elevated, prepped with Duraprep and draped in a  sterile fashion.  Exsanguinated with Esmarch.  The tourniquet was inflated  to 350 mmHg.  A straight midline incision  was made in the skin and  subcutaneous tissue.  Midline soft tissue flaps were developed at the  appropriate level, being very cautious with her skin.  A medial parapatellar  arthrotomy was performed, and the proximal medial tibial soft tissue release  was done.  She had abundant hypertrophic synovium with a pink-reddish tint  to it, and some of this was sent for pathologic review as a specimen, and  she underwent a synovectomy.  Knee was flexed, bone-on-bone contact.  The  synovial ligaments were resected.  A starting hole was made in the distal  femur, and canal was irrigated.  The effluent was clear.  Bone was found to  be relatively soft, and we were very cautious with it.  The distal femur was  found to be a size 7, rotational marks were made, and the distal femur was  cut to a size #7.  The proximal tibia medial and lateral menisci were  removed.  The tibial ligament was resected.  The starting hole for the tibia  was found to be a size 7.  The starting hole was made.  The step reamer was  utilized, and the canal was irrigated.  The effluent was clear.  Mucous  membranes were all very gently placed.  I chose an 8 mm cut initially off  the lateral side, which was the least efficient side.  This was done in a 0  degree slope.  The posterior medial and posterolateral femoral osteophytes  were removed under direct visualization.  The femoral trochanter was  prepared in a standard fashion.  This time, the size 7 femur was __________  tibia with a 10 insert.  We were both tight on flexion and extension.  The  trial was then removed, and another 2 mm was taken off the proximal tibia.  At this time, the size 7 femur and size 7 tibia with a 10 flexed insert with  excellent range of motion with soft tissue balance and alignment.  Rotational marks were made, and the delta keel was performed in a standard  fashion.  Tibial tail similarly a size 26.  It was of sufficient thickness,  and it did  ream to appropriate depth.  Locking holes were made, and excess  bone was removed.  At this point in time, the knee was irrigated with  pulsatile lavage, utilizing a moderate cement technique.  All components  were cemented into place with a size 7 tibia and size 7 femur.  A 26 patella  with a #10 insert.  At this side, we removed the tibial insert.  After we  found out it was well balanced and had flexion and extension, the  patellofemoral tract was anatomic, excess cement was removed, and bone wax  was placed to the exposed bony surfaces.  The knee was thoroughly dried  appropriately, and then a final size 10 mm thick, flexed posterior  stabilized tibial component was placed.  The knee was then put through a  range of motion with well-balanced flexion and extension.  The  patellofemoral tract was anatomic.  Two Hemovac drains were placed.  The  knee was irrigated during the closure, as was soft tissues with sequential  closure of the layers done with Vicryl, arthrotomy Vicryl, subcu Vicryl, and  the skin closed with subcu Vicryl suture.  Steri-Strips were applied.  Xeroform around the drain.  A sterile compressive dressing was applied to  the knee, and the tourniquet was deflated.  She had normal circulation in  the foot and the ankle at the end of the case.  An ice pack was applied.  She was given 1 g of Ancef for _________, the tourniquet was deflated.   At this time, the femoral nerve block was administered per the  anesthesiologist.  Following this, she was awakened.  There were no  complications or problems.  Sponge and needle counts were correct.  She was  taken to the operating room and PACU in stable condition.   Due to the time of the operative procedure, Mr. Brett Canales Chabon's assistance  was needed.      RAC/MEDQ  D:  10/31/2004  T:  10/31/2004  Job:  [id number]

## 2011-02-23 NOTE — Discharge Summary (Signed)
Kristen Wagner, Kristen Wagner NO.:  [id number]   MEDICAL RECORD NO.:  [id number]          PATIENT TYPE:  ORB   LOCATION:  4529                         FACILITY:  [facility]   PHYSICIAN:  Ellwood Dense, M.D.   DATE OF BIRTH:  09-29-1927   DATE OF ADMISSION:  11/04/2004  DATE OF DISCHARGE:  11/09/2004                                 DISCHARGE SUMMARY   DISCHARGE DIAGNOSES:  1.  Left total knee replacement.  2.  History of hypertension.  3.  Anemia.  4.  Deep venous thrombosis prophylaxis.  5.  History of insomnia.   HISTORY OF PRESENT ILLNESS:  The patient is a 77 year old white female  admitted to [facility] on October 31, 2004, with end-stage DJD of  the left knee.  The patient elected to undergo a left total knee replacement  on October 31, 2004, by Erasmo Leventhal, M.D.  Placed on Coumadin for  DVT prophylaxis.  PT report at this time indicates that the patient is  weightbearing as tolerated, minimal assistance for transfers and minimal to  moderate assistance for ambulating only 3 feet.  Postoperative course  significant for hyperkalemia with a potassium level of 3.3 and anemia.  He  received supplements x 2 days.  The patient was transferred to the [facility] Subacute Department on October 31, 2004, for more therapy.   REVIEW OF SYSTEMS:  Denies any shortness of breath, chest pain __________.  Positive for reflux and lumbago.   PAST MEDICAL HISTORY:  Significant for:  1.  Hypertension.  2.  GERD.  3.  Rheumatoid arthritis.  4.  TIA.   PAST SURGICAL HISTORY:  Significant for:  1.  C-section.  2.  Ovarian cyst removal.  3.  History of right knee.  4.  Cataract surgery.  5.  Back surgery x 2.   FAMILY HISTORY:  Noncontributory.   SOCIAL HISTORY:  The patient lives alone in a one-level home with four steps  to entry.  Daughter to help on occasion.  Denies any alcohol or tobacco use.   MEDICINES PRIOR TO ADMISSION:  1.  Allegra 60 mg daily.  2.  Quinapril 20 mg daily.  3.  Nexium 20 mg daily.  4.  Elavil 20 mg at h.s.  5.  Lopressor 25 mg b.i.d.   HOSPITAL COURSE:  Kristen Wagner was admitted to the [facility]  Subacute Department on October 31, 2004, for more therapy where she received  more than an hour of therapies daily.  Overall Kristen Wagner progressed very  well during her short five-day stay in rehabilitation.  She was discharged  at overall modified independent level.  She was able to ambulate  approximately 200 feet with rolling walker modified independent level and  transfer at modified independent level.  She was able to do bed mobility at  modified independent level.  She had approximately 0-66 degrees range of  motion in flexion in her left knee.  The surgical incision healed very well.  It demonstrated no signs of infection and no significant swelling at the  time of discharge.  Her  hospital course while in rehabilitation was  significant for anemia.  She continued to take Trinsicon one tablet twice  daily.  The latest hemoglobin was performed on November 06, 2004, and was  12.2 and hematocrit 36.1.  The patient was continued on Lovenox, as well as  Coumadin for DVT prophylaxis.  Lovenox was discontinued once Coumadin was  therapeutic.  The patient continued to take oxycodone as needed for pain and  Robaxin as needed for muscle spasm.  Blood pressure remained under  reasonably good control on lisinopril and Lopressor.  No adjustments in any  blood pressure medication were needed.  The patient requested to be on her  home dose of Allegra instead of Claritin for allergies and she received  Cepacol lozenges as needed for throat pain.  There were no major issues  occurred while the patient was on rehabilitation.  The PT report at the time  of discharge indicates she is modified independent ambulating greater than  200 feet with a rolling walker with approximately 50 degrees of flexion in  her left knee.  Able  to perform all ADLs at modified independent level.   The latest INRs indicate the latest INR was 2.4 on November 09, 2004.  The  latest hemoglobin was 2.2, hematocrit 36.1, white blood cell count 5.6,  platelet count 241, sodium 136, potassium 3.5, chloride 102, CO2 28, glucose  123, BUN 9, creatinine 0.9, AST 19, ALT 26 and alkaline phosphatase 74.  At  the time of discharge, all vitals were fairly stable.  Steri-Strips were  applied.  She had about 1+ edema.   DISPOSITION:  She was discharged home with her family.   DISCHARGE MEDICATIONS:  1.  Coumadin 4 mg daily until December 01, 2004.  2.  Trinsicon one tablet twice daily.  3.  Elavil 20 mg at night.  4.  Lopressor 25 mg twice daily.  5.  Lisinopril 20 mg daily.  6.  Resume Allegra.  7.  Robaxin 500 mg one to two tablets every six to eight hours as needed for      spasms.  8.  Oxycodone one to two tablets every four to six hours as needed for pain.   DISCHARGE INSTRUCTIONS:  No Aleve, ibuprofen or aspirin while on Coumadin.  Pain management with oxycodone and Tylenol.  No driving.  No smoking.  Use  walker.  No drinking alcohol.  She has [facility] for PT and  OT and a nurse to monitor the PT and INR.  The first draw will be Monday,  November 13, 2004.  She will follow up with Erasmo Leventhal, M.D., in two weeks.  To call for appointment.  Follow up with primary care Bradlee Heitman within four  to six weeks.  Check anemia.  Follow up with Ellwood Dense, M.D., as  needed.      LB/MEDQ  D:  11/09/2004  T:  11/09/2004  Job:  [id number]   cc:   Erasmo Leventhal, M.D.  [facility]  [address]  [address]  [place]  [place] [address]  Fax: [phone]   Dr. Lupita Leash

## 2011-02-23 NOTE — H&P (Signed)
Kristen Wagner, Kristen Wagner             ACCOUNT NO.:  [id number]   MEDICAL RECORD NO.:  [id number]          PATIENT TYPE:  INP   LOCATION:  NA                           FACILITY:  [facility]   PHYSICIAN:  Erasmo Leventhal, M.D.DATE OF BIRTH:  04/24/1927   DATE OF ADMISSION:  10/31/2004  DATE OF DISCHARGE:                                HISTORY & PHYSICAL   CHIEF COMPLAINT:  Left knee osteoarthritis.   HISTORY OF PRESENT ILLNESS:  This is a 77 year old female well known to use  with end-stage osteoarthritis of her left knee, who has failed conservative  management of her symptoms.  Due to continued pain, difficulty ambulating,  and difficulty with daily activities, the patient is now scheduled for total  knee arthroplasty of the left knee.  The surgery, risks, benefits, and  aftercare including infection, stiffness, loosening of the components, DVT  with PE, and stiffness are all discussed with the patient.  Questions  invited and answered, and surgery to go ahead as scheduled.   PAST MEDICAL HISTORY:   DRUG ALLERGIES:  X-RAY DYE.   CURRENT MEDICATIONS:  1.  Allegra 60 mg one daily.  2.  Quinapril 20 mg one daily.  3.  Omeprazole 20 mg one daily.  4.  Elavil 20 mg one q.h.s.  5.  Lopressor 25 mg b.i.d.   PREVIOUS SURGERIES:  1.  Appendectomy.  2.  Ovarian cyst excision.  3.  Cesarean section.  4.  Tonsillectomy.  5.  Laminectomy.  6.  Hysterectomy.  7.  Cataract surgery.  8.  Breast biopsy.  9.  Right rotator cuff repair.   SERIOUS MEDICAL ILLNESSES:  1.  Hypertension.  2.  Reflux.  3.  Ulcers.  4.  Rheumatoid arthritis.   SOCIAL HISTORY:  The patient is widowed.  She is retired.  She does not  smoke or drink.   FAMILY HISTORY:  Positive for coronary artery disease, hypertension,  diabetes, CVA.   REVIEW OF SYSTEMS:  CENTRAL NERVOUS SYSTEM:  Positive for headache.  PULMONARY:  Negative for shortness of breath, PND, or orthopnea.  CARDIOVASCULAR:  Positive for  hypertension.  Negative for chest pain or  palpitations.  GI:  Positive for ulcers, hiatal hernia, and reflux.  GU:  Positive for history of UTI.   PHYSICAL EXAMINATION:  VITAL SIGNS:  Blood pressure 160/98, respirations 20,  pulse 80 and regular.  GENERAL APPEARANCE:  This is a well-developed, well-nourished lady in no  acute distress.  HEENT:  Head normocephalic.  Nose patent.  Ears patent.  Pupils equal, round  and reactive to light.  Throat without injection.  NECK:  Supple without adenopathy.  Carotids 2+ without bruit.  CHEST:  Clear to auscultation.  No rales or rhonchi.  Respirations 20.  HEART:  Regular rate and rhythm at 82 beats per minute without murmur.  ABDOMEN:  Soft with active bowel sounds.  No masses or organomegaly.  NEUROLOGIC:  The patient is alert and oriented to time, place, and person.  Cranial nerves II-XII grossly intact.  EXTREMITIES:  Within normal limits with the exception of the left knee,  which  has a 5 degree flexion contracture, further flexion to 120 degrees.  She has a valgus deformity.  Neurovascular status is normal.  Dorsalis pedis  and posterior tibialis pulses are 2+.  Her right knee shows a well-healed  scar from previous synovectomy years ago.   X-rays show end-stage osteoarthritis, left knee.   IMPRESSION:  End-stage osteoarthritis, left knee.   PLAN:  Total knee arthroplasty, left knee.      SJC/MEDQ  D:  10/26/2004  T:  10/26/2004  Job:  [id number]

## 2011-02-23 NOTE — Discharge Summary (Signed)
NAMEGRACIN, SOOHOO NO.:  [id number]   MEDICAL RECORD NO.:  [id number]          PATIENT TYPE:  INP   LOCATION:  0472                         [facility]AC[facility]  [facility]   PHYSICIAN:  Erasmo Leventhal, M.D.DATE OF BIRTH:  Jun 14, 1927   DATE OF ADMISSION:  10/31/2004  DATE OF DISCHARGE:  11/04/2004                                 DISCHARGE SUMMARY   ADMISSION DIAGNOSIS:  End-stage osteoarthritis, left knee.   DISCHARGE DIAGNOSIS:  End-stage osteoarthritis, left knee.   OPERATION:  Total knee arthroplasty, left knee.   BRIEF HISTORY:  This 77 year old lady, well known to [place] with end-stage  osteoarthritis of the left knee with failure of conservative management to  control her symptoms.  Due to continued pain, difficulty with ambulation,  and difficulty with daily activities, the patient is now scheduled for total  knee arthroplasty of the left knee.  Surgery, risks, benefits and aftercare  were discussed with the patient.  Questions invited and answered.   LABORATORY VALUES:  Admission CBC within normal limits with the exception of  a slightly high hemoglobin at 15.2.  Hemoglobin and hematocrit reached a low  of 11.8 and 34.8 on the 27th.  Admission PT/PTT within normal limits.  By  discharge, her PT was 17.4 with an INR of 1.6.  Admission CMET within normal  limits with the exception of slightly low sodium at 134.  She did get a  slightly low potassium on January 27 at 3.3.  Ran mildly high glucoses in  the 120 range throughout admission.   HOSPITAL COURSE:  The patient tolerated the operative procedure well.  On  postoperative day #1, vital signs were stable.  She was afebrile.  O2 sats  were 97%.  She showed a slightly low sodium at 131.  Mildly elevated glucose  at 127.  I's and O's were good, but there was some slight decrease in  urinary output over the last eight hours.  Lungs were clear.  Heart sounds  normal.  Bowel sounds sluggish.  Guaiacs were negative.   Dressing was dry.  Drain was removed without difficulty.  We switched her IV fluids to normal  saline at 125 an hour, started CPM and PT.  Subsequently, a medical consult  was obtained for management of her fluids.   On the second postoperative day, vital signs are stable.  Temp to 101 max  but now 99.  Hemoglobin and hematocrit were acceptable.  Lungs were clear.  Bowel sounds were active.  Dressings were changed.  Wounds were benign.  Calf with minimal tenderness.  Negative Homans.  I's and O's were good.  PCA  and Foley were DC'd.  A rehab consult was obtained.   On the third postoperative day, the patient continued to do well.  Vital  signs stable.  Afebrile.  O2 sats 98%.  Hemoglobin and hematocrit were  stable.  A slight low potassium was noted at 3.3, otherwise calves were  negative.  Wound benign.  Bowel sounds sluggish.  Lungs clear.  Patient was  accepted to [facility] for rehabilitation and subsequently was discharged to the  [facility]  on January 28 in stable condition.   DISCHARGE INSTRUCTIONS:  1.  She is to go to [facility].  Rehab per their directions.  2.  Medications per their directions.  3.  Follow up with [place] in the office in two weeks or call sooner p.r.n.      problems.      SJC/MEDQ  D:  11/15/2004  T:  11/15/2004  Job:  [id number]

## 2011-02-23 NOTE — H&P (Signed)
[facility]  Patient:    Kristen Wagner, Kristen Wagner                      MRN: [id number] Adm. Date:  08/26/00 Attending:  R. Valma Cava, M.D. Dictator:   Ralene Bathe, P.A. CC:         Lonzo Cloud. Kriste Basque, M.D. [facility]  Helene Kelp, M.D.   History and Physical  CHIEF COMPLAINT:  Right shoulder pain.  HISTORY OF PRESENT ILLNESS:  Ms. Brodbeck is a 77 year old female with history of rheumatoid and osteoarthritis.  She is followed by Dr. Helene Kelp for her rheumatoid and Dr. Lonzo Cloud. [facility] medically.  She has had significant right shoulder pain stemming from a fall back in August.  On initial x-rays, she was noted to have a type 2 acromion with hypertrophic changes of the distal clavicle.  She was sent for MRI and it did indeed show a full-thickness partial retracted tear of the supraspinatus tendon with associated muscle atrophy.  Patient is quite miserable, as simply getting dressed and sleeping at night are difficult.  At this point, she wishes to proceed with surgical intervention.  Physical exam did demonstrate a significant rotator cuff weakness and pain at the [facility] joint and at this clavicle region.  Discussion regarding surgery has ensued and it was decided to undergo an open subacromial decompression, distal clavicle resection and repair of the rotator cuff.  We have requested medical clearance from Dr. Kriste Basque and this has been provided. At this point, we will proceed.  PAST MEDICAL HISTORY  1. Rheumatoid arthritis.  2. Osteoarthritis.  3. History of depression.  4. Hypertension.  5. Seasonal allergies.  6. GERD.  7. Irritable bowel disease.  PAST SURGICAL HISTORY  1. Appendectomy.  2. Removal of left ovarian cyst.  3. Tonsillectomy.  4. C-section x 2.  5. Cholecystectomy.  6. Lumbar laminectomy.  7. Hysterectomy.  8. Iridectomy on the left and cataract surgery on the left.  9. Left foot surgery -- removal of spur. 10. Repeat lumbar  surgery. 11. Breast biopsy. 12. Removal of a synovial cyst, right knee.  ALLERGIES:  No known drug allergies; however, X-RAY DYE has caused itching.  MEDICATIONS  1. Allegra 60 mg two q.d.  2. Celebrex 200 mg q.d.  3. Prilosec 20 mg q.d.  4. Premarin 0.625 mg q.d.  5. Accupril 20 mg q.d.  6. Norvasc 5 mg q.d.  7. Labetalol 200 mg two q.d.  8. Amitriptyline 10 mg at night.  9. Aspirin q.d. 10. Protegra one q.d. 11. Calcium 1000 mg q.d.  FAMILY HISTORY:  Significant for father with cardiovascular disease.  SOCIAL HISTORY:  Patient is widowed, lives alone, does not drink, does not smoke.  She currently does not work.  She does drive and take care of herself. She has two children; one son living in [place].  She is right-hand dominant.  REVIEW OF SYSTEMS:  Patient denies any recent fevers, chills or night sweats. CNS:  No blurred or double vision.  She is dizzy while rising.  HEENT:  She does have some nasal stuffiness related to seasonal allergies. CARDIOVASCULAR:  No chest pain, angina or orthopnea.  GI:  No nausea or vomiting.  She does have constipation and diarrhea, alternating, associated with irritable bowel.  GENITOURINARY:  No dysuria or hematuria. MUSCULOSKELETAL:  As related to her rheumatoid and osteoarthritis.  PHYSICAL EXAMINATION  GENERAL:  Well-developed, well-nourished 77 year old female.  NECK:  Supple.  No bruits  are appreciated on the bilateral carotids.  CHEST:  Clear to auscultation bilaterally.  No rales or rhonchi.  HEART:  Regular rate and rhythm.  No murmurs, gallops, rubs, heaves or thrills.  ABDOMEN:  Positive bowel sounds, soft, nontender.  EXTREMITIES:  Distal pulses 2+ and posterior tibialis pulses.  No pitting edema.  Extremities also as in history of present illness.  PREOPERATIVE LABORATORY DATA:  Preop labs demonstrate a urinary tract infection with positive leukocytes, wbcs 11-20, and many bacteria.  IMPRESSION  1. Right rotator  cuff tear.  2. Rheumatoid arthritis.  3. Osteoarthritis.  4. History of depression.  5. Hypertension.  6. Seasonal allergies.  7. Gastroesophageal reflux disease.  8. Preoperative urinary tract infection.  PLAN:  Right subacromial decompression, distal clavicle resection and rotator cuff repair.  We will treat her preoperatively also with Cipro 500 mg b.i.d. for three days for her urinary tract infection. DD:  08/22/00 TD:  08/22/00 Job: [id number]

## 2011-02-23 NOTE — Op Note (Signed)
[facility]  Patient:    Kristen Wagner, Kristen Wagner                 MRN: [id number] Proc. Date: 08/26/00 Adm. Date:  [id number] Attending:  Erasmo Leventhal                           Operative Report  PREOPERATIVE DIAGNOSIS:  Right shoulder rotator cuff tear with degenerative acromioclavicular joint.  POSTOPERATIVE DIAGNOSIS:  Right shoulder rotator cuff tear with degenerative acromioclavicular joint.  OPERATION PERFORMED: 1. Right shoulder open subacromial decompression. 2. Distal clavicle resection ____________ procedure. 3. Rotator cuff repair.  SURGEON:  R. Valma Cava, M.D.  ASSISTANT:  Ralene Bathe, P.A.  ANESTHESIA:  Preoperative interscalene block, general.  ESTIMATED BLOOD LOSS:  Less than 20 cc.  DRAINS:  None.  COMPLICATIONS:  None.  DISPOSITION:  To post anesthesia care unit stable.  DESCRIPTION OF PROCEDURE:  The patient was counseled in the holding area and the correct side was identified.  IV started on the left side and antibiotics were given.  An interscalene block was administered by anesthesiology.  The patient was taken to the operating room and placed in supine position under general endotracheal anesthesia.  Placed in modified beach chair position. Properly padded and bumped.  Right shoulder examined.  Full range of motion stable.  Prepped with DuraPrep draped sterile fashion.  Longitudinal incision made over the acromioclavicular joint anterior acromion through the skin subcutaneous tissues.  Small veins electrocoagulated.  Deltroid trapezial fascia was opened in line with AC with the clavicle and across the anterior aspect of the acromion.  At this point in time, the anterior deltoid was lifted with a nice thick periosteal and fibrous attachment for later repair. Basically she had a calcification throughout the entire CA ligament.  This was resected.  Attention directed to the clavicle.  It was outlined  circumferentially and the lateral 0.5 cm was removed with an oscillating saw.  Hemostasis was obtained. An anterior inferior acromioplasty was performed with a saw back to a flat type 1 acromion and it was smoothed down with a rasp.  Rotator cuff was now inspected.  She had a large retracted tear.  The cuff was mobilized both over the bursal surface and intra-articular aspect.  The edges were freshened back to healthy tissue.  Beginning medially closing the medial extension, it was closed then with a #2 Ethibond and a Vicryl in a figure-of-eight fashion.  In addition to help close the space, I incorporated the biceps tendon.  Bleeding bone was exposed at the greater tuberosity insertion.  Two Arthrex anchors were placed at the appropriate level and then the cuff was sutured down.  This was done with the arm to the side.  The shoulder put through a range of motion.  Suture line remained stable and rotator cuff was well repaired.  Wounds were copiously irrigated.  A meticulous closure and repair of the deltoid fascia and deltroid trapezial fascia was next done of the acromioclavicular joint, clavicle and anterior acromion.  This was done with Vicryl suture.  Subcu Vicryl.  Skin closed with staples.  In addition before closing the skin an Arthrex pain pump was placed at the appropriate level utilizing normal surgical technique.  Sterile dressings were applied to the shoulder.  She was then awakened and extubated. Placed into a shoulder abduction pillow with arm at the side.  Taken from the operating room in stable condition.  No complications.  Sponge and needle counts were correct. DD:  08/27/99 TD:  08/27/00 Job: [id number]

## 2011-02-23 NOTE — Discharge Summary (Signed)
[facility]  Patient:    Kristen Wagner, Kristen Wagner                 MRN: [id number] Adm. Date:  [id number] Disc. Date: [id number] Attending:  Erasmo Leventhal Dictator:   Ralene Bathe, P.A.                           Discharge Summary  ADMISSION DIAGNOSES: 1. Torn right rotator cuff. 2. Osteoarthritis. 3. Rheumatoid arthritis. 4. History of depression. 5. Hypertension. 6. Seasonal allergies. 7. Gastroesophageal reflux disease. 8. Irritable bowel disease.  DISCHARGE DIAGNOSES: 1. Torn right rotator cuff. 2. Osteoarthritis. 3. Rheumatoid arthritis. 4. History of depression. 5. Hypertension. 6. Seasonal allergies. 7. Gastroesophageal reflux disease. 8. Irritable bowel disease. 9. Status post repair of right rotator cuff with subacromial decompression,    distal clavicle resection.  OPERATIONS PERFORMED:  Right shoulder open wound subacromial decompression, distal clavicle resection, and rotator cuff repair.  SURGEON:  Dr. Thomasena Edis.  ASSISTANT:  Ralene Bathe, P.A.-C.  ANESTHESIA:  General anesthesia with interscalene block and postoperative pain pump.  BRIEF ADMISSION HISTORY:  [name] is a 77 year old female who has had refractory right shoulder pain.  MRI demonstrated a rotator cuff tear, and operative intervention was indicated.  HOSPITAL COURSE:  The patient was admitted, underwent the above-named procedure, and tolerated this well.  Intraoperative pain pump was placed.  All appropriate IV antibiotics and analgesics provided.  Postoperatively, the patient did have significant pain.  The pain pump was working on the local incision site only, but not deep.  She did require IV analgesics and was acquired an additional day for pain control measures.  On August 28, 2000, the patients pain was much more controlled.  The subcu lidocaine pump was discontinued at this time and removed without complications.  She was neurovascularly intact.   The dressing was changed, and incision was dry.  She overall was much more comfortable.  She had no nausea or vomiting and was stable for discharge to home.  Occupational therapy had seen and evaluated the patient and was stable for discharge.  LABORATORY AND X-RAY DATA:  Admission labs showed hemogram within normal limits, coagulation times normal, and chemistries normal.  Urinalysis showed moderate leukocyte esterase, a few epithelial cells, and many bacteria with yeast present.  EKG showed normal sinus rhythm, normal EKG.  Chest x-ray showed cardiomegaly without pulmonary disease.  CONDITION ON DISCHARGE:  Stable and improved.  DISPOSITION:  The patient will be discharged to home in the care of her family.  DISCHARGE INSTRUCTIONS:  Occupational therapy instructed the patient on pendulum exercises and ADLs.  Follow regular diet and regular meds.  DISCHARGE FOLLOWUP:  Follow up in our office at two weeks; call for a time.  DISCHARGE MEDICATIONS: 1. Celebrex 200 mg one b.i.d. x 5 days, then one q.d. 2. Robaxin 500 mg, #30, one q.8h. p.r.n. spasm. 3. Percocet 5 mg, #40, one to two q.4-6h. p.r.n. pain  SPECIAL DISCHARGE INSTRUCTIONS:  Call the office for any further problems. DD:  09/16/00 TD:  09/16/00 Job: [id number]

## 2011-08-03 ENCOUNTER — Other Ambulatory Visit: Payer: Self-pay | Admitting: Internal Medicine

## 2011-08-03 DIAGNOSIS — Z1231 Encounter for screening mammogram for malignant neoplasm of breast: Secondary | ICD-10-CM

## 2011-08-28 ENCOUNTER — Ambulatory Visit
Admission: RE | Admit: 2011-08-28 | Discharge: 2011-08-28 | Disposition: A | Discharge status: Home / Self Care | Payer: Medicare Other | Source: Ambulatory Visit | Attending: Internal Medicine | Admitting: Internal Medicine

## 2011-08-28 DIAGNOSIS — Z1231 Encounter for screening mammogram for malignant neoplasm of breast: Secondary | ICD-10-CM

## 2011-10-09 DIAGNOSIS — R911 Solitary pulmonary nodule: Secondary | ICD-10-CM

## 2011-10-09 HISTORY — DX: Solitary pulmonary nodule: R91.1

## 2012-07-29 ENCOUNTER — Emergency Department (HOSPITAL_BASED_OUTPATIENT_CLINIC_OR_DEPARTMENT_OTHER): Payer: Medicare Other

## 2012-07-29 ENCOUNTER — Observation Stay (HOSPITAL_BASED_OUTPATIENT_CLINIC_OR_DEPARTMENT_OTHER)
Admission: EM | Admit: 2012-07-29 | Discharge: 2012-08-01 | Disposition: A | Discharge status: Home / Self Care | Payer: Medicare Other | Attending: Internal Medicine | Admitting: Internal Medicine

## 2012-07-29 ENCOUNTER — Encounter (HOSPITAL_BASED_OUTPATIENT_CLINIC_OR_DEPARTMENT_OTHER): Payer: Self-pay | Admitting: Emergency Medicine

## 2012-07-29 DIAGNOSIS — I440 Atrioventricular block, first degree: Secondary | ICD-10-CM | POA: Diagnosis present

## 2012-07-29 DIAGNOSIS — R079 Chest pain, unspecified: Secondary | ICD-10-CM | POA: Diagnosis present

## 2012-07-29 DIAGNOSIS — I4430 Unspecified atrioventricular block: Secondary | ICD-10-CM | POA: Diagnosis not present

## 2012-07-29 DIAGNOSIS — R911 Solitary pulmonary nodule: Secondary | ICD-10-CM | POA: Diagnosis present

## 2012-07-29 DIAGNOSIS — K219 Gastro-esophageal reflux disease without esophagitis: Secondary | ICD-10-CM | POA: Insufficient documentation

## 2012-07-29 DIAGNOSIS — I441 Atrioventricular block, second degree: Secondary | ICD-10-CM | POA: Diagnosis present

## 2012-07-29 DIAGNOSIS — I2584 Coronary atherosclerosis due to calcified coronary lesion: Secondary | ICD-10-CM

## 2012-07-29 DIAGNOSIS — I2510 Atherosclerotic heart disease of native coronary artery without angina pectoris: Secondary | ICD-10-CM | POA: Diagnosis present

## 2012-07-29 DIAGNOSIS — Z8711 Personal history of peptic ulcer disease: Secondary | ICD-10-CM

## 2012-07-29 DIAGNOSIS — Z23 Encounter for immunization: Secondary | ICD-10-CM | POA: Insufficient documentation

## 2012-07-29 DIAGNOSIS — E785 Hyperlipidemia, unspecified: Secondary | ICD-10-CM | POA: Diagnosis present

## 2012-07-29 DIAGNOSIS — I10 Essential (primary) hypertension: Secondary | ICD-10-CM | POA: Diagnosis present

## 2012-07-29 DIAGNOSIS — R0602 Shortness of breath: Secondary | ICD-10-CM | POA: Insufficient documentation

## 2012-07-29 DIAGNOSIS — R0789 Other chest pain: Principal | ICD-10-CM | POA: Insufficient documentation

## 2012-07-29 HISTORY — DX: Unspecified osteoarthritis, unspecified site: M19.90

## 2012-07-29 HISTORY — DX: Depression, unspecified: F32.A

## 2012-07-29 HISTORY — DX: Major depressive disorder, single episode, unspecified: F32.9

## 2012-07-29 HISTORY — DX: Essential (primary) hypertension: I10

## 2012-07-29 LAB — COMPREHENSIVE METABOLIC PANEL
ALT: 24 U/L (ref 0–35)
AST: 27 U/L (ref 0–37)
Alkaline Phosphatase: 88 U/L (ref 39–117)
CO2: 26 mEq/L (ref 19–32)
Chloride: 102 mEq/L (ref 96–112)
GFR calc non Af Amer: 78 mL/min — ABNORMAL LOW (ref >90)
Sodium: 138 mEq/L (ref 135–145)
Total Bilirubin: 0.4 mg/dL (ref 0.3–1.2)

## 2012-07-29 LAB — CBC
Platelets: 166 10*3/uL (ref 150–400)
RBC: 4.43 MIL/uL (ref 3.87–5.11)
WBC: 5.3 10*3/uL (ref 4.0–10.5)

## 2012-07-29 LAB — APTT: aPTT: 31 seconds (ref 24–37)

## 2012-07-29 MED ORDER — DIPHENHYDRAMINE HCL 50 MG/ML IJ SOLN
25.0000 mg | Freq: Once | INTRAMUSCULAR | Status: AC
  Administered 2012-07-29: 25 mg via INTRAVENOUS
  Filled 2012-07-29: qty 1

## 2012-07-29 MED ORDER — ONDANSETRON HCL 4 MG/2ML IJ SOLN
4.0000 mg | Freq: Once | INTRAMUSCULAR | Status: AC
  Administered 2012-07-29: 4 mg via INTRAVENOUS
  Filled 2012-07-29 (×2): qty 2

## 2012-07-29 MED ORDER — IOHEXOL 350 MG/ML SOLN
80.0000 mL | Freq: Once | INTRAVENOUS | Status: AC | PRN
  Administered 2012-07-29: 80 mL via INTRAVENOUS

## 2012-07-29 MED ORDER — FAMOTIDINE IN NACL 20-0.9 MG/50ML-% IV SOLN
20.0000 mg | Freq: Once | INTRAVENOUS | Status: AC
  Administered 2012-07-29: 20 mg via INTRAVENOUS
  Filled 2012-07-29: qty 50

## 2012-07-29 MED ORDER — SODIUM CHLORIDE 0.9 % IV SOLN
1000.0000 mL | INTRAVENOUS | Status: DC
  Administered 2012-07-29: 1000 mL via INTRAVENOUS

## 2012-07-29 MED ORDER — ASPIRIN 81 MG PO CHEW
324.0000 mg | CHEWABLE_TABLET | Freq: Once | ORAL | Status: AC
  Administered 2012-07-29: 324 mg via ORAL
  Filled 2012-07-29: qty 4

## 2012-07-29 NOTE — ED Notes (Signed)
Mid sternal chest pressure began 1500 today. Feeling like she can't get a deep enough breath. Told her daughters "feels like an elephant sitting on my chest." Denies radiation.  No N/V.  Denies diaphoresis.

## 2012-07-29 NOTE — ED Provider Notes (Signed)
History     CSN: [id number]  Arrival date & time 07/29/12  1851   First MD Initiated Contact with Patient 07/29/12 1907      Chief Complaint  Patient presents with  . 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  Patient is a 78 y.o. female presenting with chest pain. The history is provided by the patient.  Chest Pain The chest pain began 1 - 2 hours ago. Duration of episode(s) is 4 minutes. The chest pain is resolved. The severity of the pain is severe. The quality of the pain is described as pressure-like (like an elephant sitting on chest). The pain does not radiate. Primary symptoms include shortness of breath. Pertinent negatives for primary symptoms include no fever, no syncope, no nausea and no vomiting.  Her past medical history is significant for hypertension.  Pertinent negatives for past medical history include no CAD, no diabetes, no hyperlipidemia, no MI and no PE.     Past Medical History  Diagnosis Date  . Hypertension   . Arthritis   . Depression     Past Surgical History  Procedure Date  . Thyroid surgery   . Shoulder surgery   . Back surgery   . Abdominal hysterectomy   . Cesarean section   . Knee surgery   . Tonsillectomy   . Cholecystectomy   . Eye surgery     No family history on file.  History  Substance Use Topics  . Smoking status: Former Games developer  . Smokeless tobacco: Not on file  . Alcohol Use: Yes     rarely    OB History    Grav Para Term Preterm Abortions TAB SAB Ect Mult Living                  Review of Systems  Constitutional: Negative for fever.  Respiratory: Positive for shortness of breath.   Cardiovascular: Positive for chest pain. Negative for syncope.  Gastrointestinal: Negative for nausea and vomiting.  Neurological: Positive for headaches.    Allergies  Codeine and Prednisone  Home Medications  No current outpatient prescriptions on file.  BP 174/84  Pulse 85  Temp  98 F (36.7 C)  Resp 15  Ht 5\' 5"  (1.651 m)  Wt 175 lb (79.379 kg)  BMI 29.12 kg/m2  SpO2 96%  Physical Exam  Nursing note and vitals reviewed. Constitutional: She appears well-developed and well-nourished. No distress.  HENT:  Head: Normocephalic and atraumatic.  Right Ear: External ear normal.  Left Ear: External ear normal.  Eyes: Conjunctivae normal are normal. Right eye exhibits no discharge. Left eye exhibits no discharge. No scleral icterus.  Neck: Neck supple. No tracheal deviation present.  Cardiovascular: Normal rate, regular rhythm and intact distal pulses.   Pulmonary/Chest: Effort normal and breath sounds normal. No stridor. No respiratory distress. She has no wheezes. She has no rales.  Abdominal: Soft. Bowel sounds are normal. She exhibits no distension. There is no tenderness. There is no rebound and no guarding.  Musculoskeletal: She exhibits no edema and no tenderness.  Neurological: She is alert. She has normal strength. No sensory deficit. Cranial nerve deficit:  no gross defecits noted. She exhibits normal muscle tone. She displays no seizure activity. Coordination normal.  Skin: Skin is warm and dry. No rash noted.  Psychiatric: She has a normal mood and affect.    ED Course  Procedures (including critical care time)  Rate: 84  Rhythm:  normal sinus rhythm  QRS Axis: normal  Intervals: normal  ST/T Wave abnormalities: normal  Conduction Disutrbances:none  Narrative Interpretation: nl  Old EKG Reviewed: no change   Labs Reviewed  CBC  COMPREHENSIVE METABOLIC PANEL  PROTIME-INR  APTT  D-DIMER, QUANTITATIVE  TROPONIN I   Dg Chest 2 View  07/29/2012  *RADIOLOGY REPORT*  Clinical Data: Chest pain and pressure, lightheadedness  CHEST - 2 VIEW  Comparison: Chest x-ray of 07/04/2004  Findings: No active infiltrate or effusion is seen.  Mediastinal contours are stable.  Surgical clips overlie the left lower neck presumably due to prior thyroid surgery.  The  heart is mildly enlarged and stable.  There are degenerative changes throughout the thoracic spine.  IMPRESSION: Stable chest x-ray.  No active lung disease.   Original Report Authenticated By: Juline Patch, M.D.      1. Chest pain       MDM  Considering the elevated d-dimer and her complaints of shortness of breath and chest pain, I have ordered a CT scan the chest. The patient mentioned that she had some type of reaction to IV dye many many years ago. She states she had itching all over but does not specifically mentioned difficulty with her breathing or an anaphylactic reaction. Does not think she had hives patient states she has a severe reaction whenever she takes steroids so we will start with pre-medicating her with a dose of Benadryl. And Pepcid  Pt tolerated the CT well.  No sign of PE.  Her symptoms are concerning for the possibility of ACS.  Coronary calcifications were noted on CT.  Will plan on transfer and admission to the hospital      Celene Kras, MD 07/29/12 2308

## 2012-07-29 NOTE — ED Notes (Signed)
Pt. Is in no distress and has no c/o chest pain.  Pt. Is in no resp. Distress.  No shortness of breath noted.

## 2012-07-29 NOTE — ED Notes (Signed)
Pt. Reports no chest pain and no pressure in her chest.  Pt. Is in no distress

## 2012-07-30 ENCOUNTER — Encounter (HOSPITAL_COMMUNITY): Payer: Self-pay | Admitting: Internal Medicine

## 2012-07-30 DIAGNOSIS — Z9889 Other specified postprocedural states: Secondary | ICD-10-CM | POA: Insufficient documentation

## 2012-07-30 DIAGNOSIS — I2510 Atherosclerotic heart disease of native coronary artery without angina pectoris: Secondary | ICD-10-CM | POA: Diagnosis present

## 2012-07-30 DIAGNOSIS — I440 Atrioventricular block, first degree: Secondary | ICD-10-CM | POA: Diagnosis present

## 2012-07-30 DIAGNOSIS — I10 Essential (primary) hypertension: Secondary | ICD-10-CM

## 2012-07-30 DIAGNOSIS — Z8711 Personal history of peptic ulcer disease: Secondary | ICD-10-CM

## 2012-07-30 DIAGNOSIS — R079 Chest pain, unspecified: Secondary | ICD-10-CM

## 2012-07-30 DIAGNOSIS — I441 Atrioventricular block, second degree: Secondary | ICD-10-CM | POA: Diagnosis present

## 2012-07-30 DIAGNOSIS — I4430 Unspecified atrioventricular block: Secondary | ICD-10-CM | POA: Diagnosis not present

## 2012-07-30 DIAGNOSIS — R911 Solitary pulmonary nodule: Secondary | ICD-10-CM | POA: Diagnosis present

## 2012-07-30 LAB — CBC WITH DIFFERENTIAL/PLATELET
Eosinophils Absolute: 0.1 10*3/uL (ref 0.0–0.7)
Eosinophils Relative: 3 % (ref 0–5)
Lymphs Abs: 2.6 10*3/uL (ref 0.7–4.0)
MCH: 32.5 pg (ref 26.0–34.0)
MCV: 94.9 fL (ref 78.0–100.0)
Platelets: 171 10*3/uL (ref 150–400)
RBC: 4.34 MIL/uL (ref 3.87–5.11)
RDW: 12.8 % (ref 11.5–15.5)

## 2012-07-30 LAB — COMPREHENSIVE METABOLIC PANEL
ALT: 22 U/L (ref 0–35)
AST: 26 U/L (ref 0–37)
CO2: 27 mEq/L (ref 19–32)
Chloride: 103 mEq/L (ref 96–112)
GFR calc non Af Amer: 78 mL/min — ABNORMAL LOW (ref >90)
Sodium: 138 mEq/L (ref 135–145)
Total Bilirubin: 0.5 mg/dL (ref 0.3–1.2)

## 2012-07-30 LAB — TROPONIN I: Troponin I: <0.3 ng/mL (ref <0.30)

## 2012-07-30 LAB — TSH: TSH: 0.88 u[IU]/mL (ref 0.350–4.500)

## 2012-07-30 MED ORDER — ACETAMINOPHEN 325 MG PO TABS
650.0000 mg | ORAL_TABLET | Freq: Four times a day (QID) | ORAL | Status: DC | PRN
  Administered 2012-07-30 (×2): 650 mg via ORAL
  Filled 2012-07-30 (×3): qty 2

## 2012-07-30 MED ORDER — ONDANSETRON HCL 4 MG PO TABS: 4.0000 mg | ORAL_TABLET | Freq: Four times a day (QID) | ORAL | Status: DC | PRN

## 2012-07-30 MED ORDER — ZOLPIDEM TARTRATE 5 MG PO TABS
10.0000 mg | ORAL_TABLET | Freq: Every day | ORAL | Status: DC
  Administered 2012-07-30 – 2012-07-31 (×2): 10 mg via ORAL
  Filled 2012-07-30 (×2): qty 2

## 2012-07-30 MED ORDER — ONDANSETRON HCL 4 MG/2ML IJ SOLN: 4.0000 mg | Freq: Four times a day (QID) | INTRAMUSCULAR | Status: DC | PRN

## 2012-07-30 MED ORDER — ACETAMINOPHEN 650 MG RE SUPP: 650.0000 mg | Freq: Four times a day (QID) | RECTAL | Status: DC | PRN

## 2012-07-30 MED ORDER — HYDRALAZINE HCL 20 MG/ML IJ SOLN: 10.0000 mg | INTRAMUSCULAR | Status: DC | PRN

## 2012-07-30 MED ORDER — SODIUM CHLORIDE 0.9 % IJ SOLN
3.0000 mL | Freq: Two times a day (BID) | INTRAMUSCULAR | Status: DC
  Administered 2012-07-30 – 2012-07-31 (×3): 3 mL via INTRAVENOUS

## 2012-07-30 MED ORDER — INFLUENZA VIRUS VACC SPLIT PF IM SUSP
0.5000 mL | INTRAMUSCULAR | Status: AC
  Administered 2012-08-01: 0.5 mL via INTRAMUSCULAR
  Filled 2012-07-30: qty 0.5

## 2012-07-30 MED ORDER — ASPIRIN EC 81 MG PO TBEC
81.0000 mg | DELAYED_RELEASE_TABLET | Freq: Every day | ORAL | Status: DC
  Administered 2012-07-30 – 2012-07-31 (×2): 81 mg via ORAL
  Filled 2012-07-30 (×3): qty 1

## 2012-07-30 MED ORDER — LORATADINE 10 MG PO TABS
10.0000 mg | ORAL_TABLET | Freq: Every day | ORAL | Status: DC
  Administered 2012-07-30 – 2012-08-01 (×3): 10 mg via ORAL
  Filled 2012-07-30 (×4): qty 1

## 2012-07-30 MED ORDER — PANTOPRAZOLE SODIUM 40 MG PO TBEC
40.0000 mg | DELAYED_RELEASE_TABLET | Freq: Every day | ORAL | Status: DC
  Administered 2012-07-30 – 2012-07-31 (×2): 40 mg via ORAL
  Filled 2012-07-30 (×2): qty 1

## 2012-07-30 MED ORDER — SODIUM CHLORIDE 0.9 % IJ SOLN
3.0000 mL | Freq: Two times a day (BID) | INTRAMUSCULAR | Status: DC
  Administered 2012-07-30 – 2012-07-31 (×2): 3 mL via INTRAVENOUS

## 2012-07-30 NOTE — Care Management Note (Unsigned)
    Page 1 of 1   07/30/2012     11:23:08 AM   CARE MANAGEMENT NOTE 07/30/2012  Patient:  Kristen Wagner, Kristen Wagner   Account Number:  [id number]  Date Initiated:  07/30/2012  Documentation initiated by:  GRAVES-BIGELOW,Kristeena Meineke  Subjective/Objective Assessment:   Pt admitted with cp. Has family support.     Action/Plan:   CM will continue to monitor for disposition needs.   Anticipated DC Date:  08/01/2012   Anticipated DC Plan:  HOME W HOME HEALTH SERVICES      DC Planning Services  CM consult      Choice offered to / List presented to:             Status of service:  In process, will continue to follow Medicare Important Message given?   (If response is "NO", the following Medicare IM given date fields will be blank) Date Medicare IM given:   Date Additional Medicare IM given:    Discharge Disposition:    Per UR Regulation:  Reviewed for med. necessity/level of care/duration of stay  If discussed at Long Length of Stay Meetings, dates discussed:    Comments:

## 2012-07-30 NOTE — Consult Note (Signed)
Admit date: 07/29/2012 Referring Physician  Dr. Kevan Ny Primary Physician Pearla Dubonnet, MD Primary Cardiologist  Otto Herb Reason for Consultation  Chest pain, AV block  HPI: 78 year old female with no prior cardiovascular history with hypertension, currently taking Diovan only, GERD occasionally taking Protonix, arthritis who was admitted with chest discomfort yesterday afternoon. She states that she has been battling a cough for the past 2-3 weeks with no associated fevers no nausea no chills. Yesterday she was ironing her close when she felt a questionable dizzy spell, possible elephantlike chest pressure which lasted a few minutes with associated mild shortness of breath . She finished her ironing. Her family came over for dinner and she states that she ate approximately 2-3 bites of steak and just did not feel well. Because of this, she came to the emergency department for further evaluation. She denies any fevers, chills, rigors, rashes. She states that she has been taking Protonix on a when necessary basis because of lower abdominal burning. She clearly states that she has not had any syncopal episodes and her dizziness when asked further is more described as slight gait instability. She does not feel darkening of her vision. She does not take any beta blockers. TSH is normal. Her initial EKG demonstrates sinus rhythm with no other abnormalities. She had a normal PR interval of yesterday at 7:03 PM. Her second EKG however demonstrates a fairly significant first degree AV block with PR interval of 406 ms. There is no evidence of LAFB. Normal QRS duration. Telemetry also demonstrates occasional second degree AV block type 1 (progressively lengthening PR interval with dropped beat). Her heart rate reached the low 50s at points during the night. Once again at home, she states she's had some gait instability but no syncope.  She underwent CT scan in the emergency department because of an  elevated d-dimer and this was negative for pulmonary embolism however did show diffuse coronary artery calcification, personally viewed, within the LAD distribution.  Currently she is feeling well, no further chest pain, no shortness of breath, no dizziness.    PMH:   Past Medical History  Diagnosis Date  . Hypertension   . Arthritis   . Depression     PSH:   Past Surgical History  Procedure Date  . Thyroid surgery   . Shoulder surgery   . Back surgery   . Abdominal hysterectomy   . Cesarean section   . Knee surgery   . Tonsillectomy   . Cholecystectomy   . Eye surgery   . Appendectomy    Allergies:  Codeine; Iohexol; and Prednisone Prior to Admit Meds:   Prescriptions prior to admission  Medication Sig Dispense Refill  . acetaminophen (TYLENOL) 500 MG tablet Take 500 mg by mouth every 6 (six) hours as needed. Arthritis pain      . citalopram (CELEXA) 40 MG tablet Take 40 mg by mouth daily.      . dorzolamide-timolol (COSOPT) 22.3-6.8 MG/ML ophthalmic solution Place 1 drop into both eyes 2 (two) times daily.      Marland Kitchen ibuprofen (ADVIL,MOTRIN) 600 MG tablet Take 600 mg by mouth 3 (three) times daily as needed. For pain      . metroNIDAZOLE (METROGEL) 0.75 % gel Apply 1 application topically at bedtime.      . Multiple Vitamin (MULTIVITAMIN WITH MINERALS) TABS Take 1 tablet by mouth daily.      . pantoprazole (PROTONIX) 40 MG tablet Take 40 mg by mouth daily.      Marland Kitchen  valsartan (DIOVAN) 160 MG tablet Take 160 mg by mouth daily.      Marland Kitchen zolpidem (AMBIEN) 10 MG tablet Take 10 mg by mouth at bedtime.       Fam HX:    Family History  Problem Relation Age of Onset  . Stroke Mother    Social HX:    History   Social History  . Marital Status: Widowed    Spouse Name: N/A    Number of Children: N/A  . Years of Education: N/A   Occupational History  . Not on file.   Social History Main Topics  . Smoking status: Former Games developer  . Smokeless tobacco: Not on file  . Alcohol Use:  Yes     rarely  . Drug Use: No  . Sexually Active:    Other Topics Concern  . Not on file   Social History Narrative  . No narrative on file     ROS:  All 11 ROS were addressed and are negative except what is stated in the HPI  Physical Exam: Blood pressure 132/76, pulse 62, temperature 97.8 F (36.6 C), resp. rate 18, height 5\' 5"  (1.651 m), weight 81.058 kg (178 lb 11.2 oz), SpO2 96.00%.    General: Well developed, well nourished, in no acute distress Head: Eyes PERRLA, No xanthomas.   Normal cephalic and atramatic  Lungs:   Clear bilaterally to auscultation and percussion. Normal respiratory effort. No wheezes, no rales. Heart:   HRRR S1 S2 Pulses are 2+ & equal. No appreciable murmur, occasional ectopy noted.   No carotid bruit. No JVD.  No abdominal bruits. Abdomen: Bowel sounds are positive, abdomen soft and non-tender without masses. No hepatosplenomegaly. Msk:  Back normal. Normal strength and tone for age. Extremities:   No clubbing, cyanosis or edema.  DP +1, prior knee replacement surgery scars noted. Prior right shoulder scar noted. Neuro: Alert and oriented X 3, non-focal, MAE x 4 GU: Deferred Rectal: Deferred Psych:  Good affect, responds appropriately    Labs:   Lab Results  Component Value Date   WBC 5.5 07/30/2012   HGB 14.1 07/30/2012   HCT 41.2 07/30/2012   MCV 94.9 07/30/2012   PLT 171 07/30/2012    Lab 07/30/12 0235  NA 138  K 3.8  CL 103  CO2 27  BUN 12  CREATININE 0.70  CALCIUM 9.1  PROT 7.0  BILITOT 0.5  ALKPHOS 85  ALT 22  AST 26  GLUCOSE 118*   No results found for this basename: PTT   Lab Results  Component Value Date   INR 0.99 07/29/2012   Lab Results  Component Value Date   TROPONINI <0.30 07/30/2012        Radiology:  Dg Chest 2 View  07/29/2012  *RADIOLOGY REPORT*  Clinical Data: Chest pain and pressure, lightheadedness  CHEST - 2 VIEW  Comparison: Chest x-ray of 07/04/2004  Findings: No active infiltrate or  effusion is seen.  Mediastinal contours are stable.  Surgical clips overlie the left lower neck presumably due to prior thyroid surgery.  The heart is mildly enlarged and stable.  There are degenerative changes throughout the thoracic spine.  IMPRESSION: Stable chest x-ray.  No active lung disease.   Original Report Authenticated By: Juline Patch, M.D.    Ct Angio Chest Pe W/cm &/or Wo Cm  07/29/2012  *RADIOLOGY REPORT*  Clinical Data: Chest pain, elevated D-dimer  CT ANGIOGRAPHY CHEST  Technique:  Multidetector CT imaging of the chest using  the standard protocol during bolus administration of intravenous contrast. Multiplanar reconstructed images including MIPs were obtained and reviewed to evaluate the vascular anatomy.  Contrast: 80mL OMNIPAQUE IOHEXOL 350 MG/ML SOLN  Comparison: 07/29/2012 chest radiograph  Findings: Pulmonary arterial branches are patent.  Central airways are patent.  Mild peripheral reticular opacities may reflect atelectasis or scarring.  No confluent airspace opacity.  No pneumothorax.  5 mm nodule right lower lobe on image 79 of series 6.  Small hiatal hernia.  Postoperative changes of the left lobe of the thyroid gland.  The right lobe is enlarged and heterogeneous echogenicity.  There is a punctate calcification as well.  Ectatic aorta with scattered atherosclerotic disease.  Heart size upper normal.  Coronary artery calcification.  No pleural or pericardial effusion.  No intrathoracic lymphadenopathy. Limited images through the upper abdomen show no acute finding.  Multilevel degenerative changes of the imaged spine. No acute or aggressive appearing osseous lesion. A disc osteophyte complex at T2-3 results in moderate central canal narrowing.  IMPRESSION: No pulmonary embolism.  Coronary artery calcification.  Small hiatal hernia.  Status post left thyroidectomy.  The right lobe is enlarged and heterogeneous echogenicity, containing a punctate calcification. Recommend thyroid  ultrasound follow-up.  5 mm nodule right lower lobe. If the patient is at high risk for bronchogenic carcinoma, follow-up chest CT at 6-12 months is recommended.  If the patient is at low risk for bronchogenic carcinoma, follow-up chest CT at 12 months is recommended.  This recommendation follows the consensus statement: Guidelines for Management of Small Pulmonary Nodules Detected on CT Scans: A Statement from the Fleischner Society as published in Radiology 2005; 237:395-400.  Other findings as above.   Original Report Authenticated By: Waneta Martins, M.D.    Personally viewed.  EKG:  As described above, two compared. Personally viewed.   ASSESSMENT/PLAN:   78 year old female with transient chest discomfort, coronary artery calcification, first degree AV block, transient second-degree AV block type I, recent cough, hypertension, no syncope.  1. Chest pain-with her coronary artery calcification, fleeting chest discomfort, age, hypertension further evaluation is warranted. Her troponin is normal. Her EKG does not show any ST segment depression consistent with ischemia. She does have transient first degree AV block/second-degree AV block type I. Because of these factors, I will set her up for a dobutamine nuclear stress test. They do not have availability today in nuclear medicine, this will be done tomorrow morning. I chose dobutamine because of her second-degree AV block/first degree AV block. I wish not to use Lexiscan in this situation. We also discussed the alternative of cardiac catheterization, discussed the risks and benefits of this procedure, and she wished to proceed with noninvasive evaluation. This is reasonable.  2. First degree AV block/second-degree AV block type I-I was trying to gather a history from her suggestive of near syncope or syncope. She denies both. She clearly states to me that she has episodes of transient "dizziness "which are described as being unstable on her feet/gait  instability. Certainly with her conduction disorder, this may be construed as related to possible transient bradycardia. As an outpatient, I would like for her to wear an event monitor for 30 days to try to detect any worsening/adverse arrhythmias. We did discuss the possibility of pacemaker. I will check echocardiogram. TSH normal.  3. Hypertension-currently stable. Avoid AV nodal blocking agents.  Donato Schultz, MD  07/30/2012  10:16 AM

## 2012-07-30 NOTE — Progress Notes (Signed)
Pt resting in bed and pt's PR interval became progressively longer. Pt's PR interval on admission was 0.25, now, pt's PR interval is 0.43. An EKG was obtained MD on call made aware. No new orders received at this time. Will cont to monitor pt.

## 2012-07-30 NOTE — H&P (Signed)
Kristen Wagner is an 78 y.o. female.   Patient was seen and examined on July 30, 2012. PCP - Dr. Candyce Churn. Chief Complaint: Chest discomfort. HPI: 78 year-old female history of hypertension, arthritis, anxiety and colitis presented to the ER at the [facility] with complaints of chest discomfort. Patient experienced some chest discomfort around 2 PM yesterday at her house. Patient states that initially she felt dizzy and she had some chest discomfort and she felt nauseous. This lasted for around 3 minutes. Since it is concerning she came to the ER. In the ER patient's EKG was unremarkable cardiac enzymes are negative. Since patient had elevated d-dimer CT angiogram of the chest was done which was negative for PE. Patient at this time has been admitted for observation. Patient denies any chest pain or shortness of breath at this time.   Past Medical History  Diagnosis Date  . Hypertension   . Arthritis   . Depression     Past Surgical History  Procedure Date  . Thyroid surgery   . Shoulder surgery   . Back surgery   . Abdominal hysterectomy   . Cesarean section   . Knee surgery   . Tonsillectomy   . Cholecystectomy   . Eye surgery   . Appendectomy     Family History  Problem Relation Age of Onset  . Stroke Mother    Social History:  reports that she has quit smoking. She does not have any smokeless tobacco history on file. She reports that she drinks alcohol. She reports that she does not use illicit drugs.  Allergies:  Allergies  Allergen Reactions  . Codeine Nausea And Vomiting  . Iohexol Itching  . Prednisone     No prescriptions prior to admission    Results for orders placed during the hospital encounter of 07/29/12 (from the past 48 hour(s))  CBC     Status: Normal   Collection Time   07/29/12  7:20 PM      Component Value Range Comment   WBC 5.3  4.0 - 10.5 K/uL    RBC 4.43  3.87 - 5.11 MIL/uL    Hemoglobin 14.2  12.0 - 15.0 g/dL    HCT 16.1  09.6 - 04.5 %    MCV 94.1  78.0 - 100.0 fL    MCH 32.1  26.0 - 34.0 pg    MCHC 34.1  30.0 - 36.0 g/dL    RDW 40.9  81.1 - 91.4 %    Platelets 166  150 - 400 K/uL   COMPREHENSIVE METABOLIC PANEL     Status: Abnormal   Collection Time   07/29/12  7:20 PM      Component Value Range Comment   Sodium 138  135 - 145 mEq/L    Potassium 3.8  3.5 - 5.1 mEq/L    Chloride 102  96 - 112 mEq/L    CO2 26  19 - 32 mEq/L    Glucose, Bld 158 (*) 70 - 99 mg/dL    BUN 13  6 - 23 mg/dL    Creatinine, Ser 7.82  0.50 - 1.10 mg/dL    Calcium 9.7  8.4 - 95.6 mg/dL    Total Protein 7.5  6.0 - 8.3 g/dL    Albumin 3.7  3.5 - 5.2 g/dL    AST 27  0 - 37 U/L    ALT 24  0 - 35 U/L    Alkaline Phosphatase 88  39 - 117  U/L    Total Bilirubin 0.4  0.3 - 1.2 mg/dL    GFR calc non Af Amer 78 (*) >90 mL/min    GFR calc Af Amer >90  >90 mL/min   PROTIME-INR     Status: Normal   Collection Time   07/29/12  7:20 PM      Component Value Range Comment   Prothrombin Time 13.0  11.6 - 15.2 seconds    INR 0.99  0.00 - 1.49   APTT     Status: Normal   Collection Time   07/29/12  7:20 PM      Component Value Range Comment   aPTT 31  24 - 37 seconds   D-DIMER, QUANTITATIVE     Status: Abnormal   Collection Time   07/29/12  7:20 PM      Component Value Range Comment   D-Dimer, Quant 0.75 (*) 0.00 - 0.48 ug/mL-FEU   TROPONIN I     Status: Normal   Collection Time   07/29/12  7:20 PM      Component Value Range Comment   Troponin I <0.30  <0.30 ng/mL    Dg Chest 2 View  07/29/2012  *RADIOLOGY REPORT*  Clinical Data: Chest pain and pressure, lightheadedness  CHEST - 2 VIEW  Comparison: Chest x-ray of 07/04/2004  Findings: No active infiltrate or effusion is seen.  Mediastinal contours are stable.  Surgical clips overlie the left lower neck presumably due to prior thyroid surgery.  The heart is mildly enlarged and stable.  There are degenerative changes throughout the thoracic spine.  IMPRESSION: Stable chest  x-ray.  No active lung disease.   Original Report Authenticated By: Juline Patch, M.D.    Ct Angio Chest Pe W/cm &/or Wo Cm  07/29/2012  *RADIOLOGY REPORT*  Clinical Data: Chest pain, elevated D-dimer  CT ANGIOGRAPHY CHEST  Technique:  Multidetector CT imaging of the chest using the standard protocol during bolus administration of intravenous contrast. Multiplanar reconstructed images including MIPs were obtained and reviewed to evaluate the vascular anatomy.  Contrast: 80mL OMNIPAQUE IOHEXOL 350 MG/ML SOLN  Comparison: 07/29/2012 chest radiograph  Findings: Pulmonary arterial branches are patent.  Central airways are patent.  Mild peripheral reticular opacities may reflect atelectasis or scarring.  No confluent airspace opacity.  No pneumothorax.  5 mm nodule right lower lobe on image 79 of series 6.  Small hiatal hernia.  Postoperative changes of the left lobe of the thyroid gland.  The right lobe is enlarged and heterogeneous echogenicity.  There is a punctate calcification as well.  Ectatic aorta with scattered atherosclerotic disease.  Heart size upper normal.  Coronary artery calcification.  No pleural or pericardial effusion.  No intrathoracic lymphadenopathy. Limited images through the upper abdomen show no acute finding.  Multilevel degenerative changes of the imaged spine. No acute or aggressive appearing osseous lesion. A disc osteophyte complex at T2-3 results in moderate central canal narrowing.  IMPRESSION: No pulmonary embolism.  Coronary artery calcification.  Small hiatal hernia.  Status post left thyroidectomy.  The right lobe is enlarged and heterogeneous echogenicity, containing a punctate calcification. Recommend thyroid ultrasound follow-up.  5 mm nodule right lower lobe. If the patient is at high risk for bronchogenic carcinoma, follow-up chest CT at 6-12 months is recommended.  If the patient is at low risk for bronchogenic carcinoma, follow-up chest CT at 12 months is recommended.  This  recommendation follows the consensus statement: Guidelines for Management of Small Pulmonary Nodules Detected on CT  Scans: A Statement from the Fleischner Society as published in Radiology 2005; 237:395-400.  Other findings as above.   Original Report Authenticated By: Waneta Martins, M.D.     Review of Systems  Constitutional: Negative.   HENT: Positive for congestion.   Eyes: Negative.   Respiratory: Negative.   Cardiovascular: Positive for chest pain.  Gastrointestinal: Positive for nausea.  Genitourinary: Negative.   Musculoskeletal: Negative.   Skin: Negative.   Neurological: Positive for dizziness and headaches.  Endo/Heme/Allergies: Negative.   Psychiatric/Behavioral: Negative.     Blood pressure 150/82, pulse 69, temperature 97.5 F (36.4 C), resp. rate 18, height 5\' 5"  (1.651 m), weight 81.058 kg (178 lb 11.2 oz), SpO2 95.00%. Physical Exam  Constitutional: She is oriented to person, place, and time. She appears well-developed and well-nourished. No distress.  HENT:  Head: Normocephalic and atraumatic.  Right Ear: External ear normal.  Left Ear: External ear normal.  Nose: Nose normal.  Mouth/Throat: Oropharynx is clear and moist. No oropharyngeal exudate.  Eyes: Conjunctivae normal are normal. Pupils are equal, round, and reactive to light. Right eye exhibits no discharge. Left eye exhibits no discharge. No scleral icterus.  Neck: Normal range of motion. Neck supple.  Cardiovascular: Normal rate and regular rhythm.   Respiratory: Effort normal and breath sounds normal. No respiratory distress. She has no wheezes. She has no rales.  GI: Soft. Bowel sounds are normal. She exhibits no distension. There is no tenderness. There is no rebound and no guarding.  Musculoskeletal: She exhibits no edema and no tenderness.  Neurological: She is alert and oriented to person, place, and time.       Moves upper and lower extremities.  Skin: Skin is warm and dry. She is not  diaphoretic.     Assessment/Plan #1. Chest pain - looks atypical. Presently chest pain-free. Cycle cardiac markers.  #2. Hypertension - continue home medications. Patient's home medications names are not available at this time will need to verify and continue accordingly. For now I have placed patient on when necessary IV hydralazine for systolic blood pressure more than 160. #3. Anxiety - continue home medications. #4. Headache - patient has been having some headache after she got admitted. Headache is mostly frontal and patient has been having some sinus congestion for last 2 weeks. I think her headache is related to this. For now I am ordering Tylenol when necessary for the headaches.  Patient's CT chest showed pulmonary nodule for which patient will need followup CT chest through her primary care within 6 months. Patient's thyroid also shows punctate calcifications for which patient will need followup through her primary care as outpatient.  Patient has been admitted under Dr. Marden Noble service.  Matisse Roskelley N. 07/30/2012, 2:20 AM

## 2012-07-30 NOTE — Progress Notes (Signed)
Subjective: Kristen Wagner is chest pain free since yesterday.  She has been having some AV nodal block and PR interval is about to 0.43.  Occasional PVC.  Will have cardiology assess prior to discharge.  May be able to workup as an outpatient.  She does have a history of GERD on Protonix and has had symptoms of anterior chest pain from reflux in the past.  D-dimer was borderline elevated and CT angio the chest was negative  Objective: Weight change:  No intake or output data in the 24 hours ending 07/30/12 0723  Filed Vitals:   07/29/12 2330 07/30/12 0100 07/30/12 0133 07/30/12 0500  BP: 143/92 185/120 150/82 132/76  Pulse: 73 69  62  Temp:  97.5 F (36.4 C)  97.8 F (36.6 C)  Resp: 14 18  18   Height:  5\' 5"  (1.651 m)    Weight:  81.058 kg (178 lb 11.2 oz)    SpO2: 98% 95%  96%   General Appearance: Alert, cooperative, no distress, appears stated age Head: Normocephalic, without obvious abnormality, atraumatic Neck: Supple, symmetrical Lungs: Clear to auscultation bilaterally, respirations unlabored Heart: Regular rate and rhythm, S1 and S2 normal, no murmur, rub or gallop Abdomen: Soft, minimal chronic tenderness to palpation in the upper quadrants and epigastrium unchanged from prior exams, bowel sounds active all four quadrants, no masses, no organomegaly Extremities: Extremities normal, atraumatic, no cyanosis or edema Pulses: 2+ and symmetric all extremities Skin: Skin color, texture, turgor normal, no rashes or lesions Neuro: CNII-XII intact. Normal strength, sensation and reflexes throughout   Lab Results:  [facility] 07/30/12 0235 07/29/12 1920  NA 138 138  K 3.8 3.8  CL 103 102  CO2 27 26  GLUCOSE 118* 158*  BUN 12 13  CREATININE 0.70 0.70  CALCIUM 9.1 9.7  MG -- --  PHOS -- --    Basename 07/30/12 0235 07/29/12 1920  AST 26 27  ALT 22 24  ALKPHOS 85 88  BILITOT 0.5 0.4  PROT 7.0 7.5  ALBUMIN 3.4* 3.7    Basename 07/30/12 0235  LIPASE 23  AMYLASE --     Basename 07/30/12 0235 07/29/12 1920  WBC 5.5 5.3  NEUTROABS 2.4 --  HGB 14.1 14.2  HCT 41.2 41.7  MCV 94.9 94.1  PLT 171 166    Basename 07/30/12 0233 07/29/12 1920  CKTOTAL -- --  CKMB -- --  CKMBINDEX -- --  TROPONINI <0.30 <0.30   No components found with this basename: POCBNP:3  Basename 07/29/12 1920  DDIMER 0.75*   No results found for this basename: HGBA1C:2 in the last 72 hours No results found for this basename: CHOL:2,HDL:2,LDLCALC:2,TRIG:2,CHOLHDL:2,LDLDIRECT:2 in the last 72 hours No results found for this basename: TSH,T4TOTAL,FREET3,T3FREE,THYROIDAB in the last 72 hours No results found for this basename: VITAMINB12:2,FOLATE:2,FERRITIN:2,TIBC:2,IRON:2,RETICCTPCT:2 in the last 72 hours  Studies/Results: Dg Chest 2 View  07/29/2012  *RADIOLOGY REPORT*  Clinical Data: Chest pain and pressure, lightheadedness  CHEST - 2 VIEW  Comparison: Chest x-ray of 07/04/2004  Findings: No active infiltrate or effusion is seen.  Mediastinal contours are stable.  Surgical clips overlie the left lower neck presumably due to prior thyroid surgery.  The heart is mildly enlarged and stable.  There are degenerative changes throughout the thoracic spine.  IMPRESSION: Stable chest x-ray.  No active lung disease.   Original Report Authenticated By: Juline Patch, M.D.    Ct Angio Chest Pe W/cm &/or Wo Cm  07/29/2012  *RADIOLOGY REPORT*  Clinical Data: Chest pain,  elevated D-dimer  CT ANGIOGRAPHY CHEST  Technique:  Multidetector CT imaging of the chest using the standard protocol during bolus administration of intravenous contrast. Multiplanar reconstructed images including MIPs were obtained and reviewed to evaluate the vascular anatomy.  Contrast: 80mL OMNIPAQUE IOHEXOL 350 MG/ML SOLN  Comparison: 07/29/2012 chest radiograph  Findings: Pulmonary arterial branches are patent.  Central airways are patent.  Mild peripheral reticular opacities may reflect atelectasis or scarring.  No  confluent airspace opacity.  No pneumothorax.  5 mm nodule right lower lobe on image 79 of series 6.  Small hiatal hernia.  Postoperative changes of the left lobe of the thyroid gland.  The right lobe is enlarged and heterogeneous echogenicity.  There is a punctate calcification as well.  Ectatic aorta with scattered atherosclerotic disease.  Heart size upper normal.  Coronary artery calcification.  No pleural or pericardial effusion.  No intrathoracic lymphadenopathy. Limited images through the upper abdomen show no acute finding.  Multilevel degenerative changes of the imaged spine. No acute or aggressive appearing osseous lesion. A disc osteophyte complex at T2-3 results in moderate central canal narrowing.  IMPRESSION: No pulmonary embolism.  Coronary artery calcification.  Small hiatal hernia.  Status post left thyroidectomy.  The right lobe is enlarged and heterogeneous echogenicity, containing a punctate calcification. Recommend thyroid ultrasound follow-up.  5 mm nodule right lower lobe. If the patient is at high risk for bronchogenic carcinoma, follow-up chest CT at 6-12 months is recommended.  If the patient is at low risk for bronchogenic carcinoma, follow-up chest CT at 12 months is recommended.  This recommendation follows the consensus statement: Guidelines for Management of Small Pulmonary Nodules Detected on CT Scans: A Statement from the Fleischner Society as published in Radiology 2005; 237:395-400.  Other findings as above.   Original Report Authenticated By: Waneta Martins, M.D.    Medications: Scheduled Meds:   . aspirin  324 mg Oral Once  . aspirin EC  81 mg Oral Daily  . diphenhydrAMINE  25 mg Intravenous Once  . famotidine (PEPCID) IV  20 mg Intravenous Once  . influenza  inactive virus vaccine  0.5 mL Intramuscular Tomorrow-1000  . ondansetron  4 mg Intravenous Once  . pantoprazole  40 mg Oral Q1200  . sodium chloride  3 mL Intravenous Q12H  . sodium chloride  3 mL  Intravenous Q12H   Continuous Infusions:   . DISCONTD: sodium chloride 1,000 mL (07/29/12 2012)   PRN Meds:.acetaminophen, acetaminophen, hydrALAZINE, iohexol, ondansetron (ZOFRAN) IV, ondansetron  Assessment/Plan: Patient Active Problem List   Diagnosis Date Noted  . AV block - could be related to cardiac ischemia.  Will consult cardiology  07/30/2012  . Chest pain -  Will check one more cardiac enzyme set - first 2 were negative  07/29/2012  . HYPERLIPIDEMIA - aware  11/02/2009  . DEPRESSION  -  stable  11/02/2009  . HYPERTENSION - variable blood pressure, frequently up with anxiety  11/02/2009  . ACID REFLUX DISEASE - continue PPI therapy  11/02/2009  . DIVERTICULITIS, COLON 11/02/2009  . IRRITABLE BOWEL SYNDROME - aware  11/02/2009  . GASTRIC ULCER, HX OF - aware  11/02/2009     LOS: 1 day   Jacqualyn Sedgwick NEVILL 07/30/2012, 7:23 AM

## 2012-07-31 ENCOUNTER — Observation Stay (HOSPITAL_COMMUNITY): Payer: Medicare Other

## 2012-07-31 MED ORDER — DOBUTAMINE INFUSION FOR EP/ECHO/NUC (1000 MCG/ML)
INTRAVENOUS | Status: AC
  Administered 2012-07-31: 250000 ug
  Filled 2012-07-31: qty 250

## 2012-07-31 MED ORDER — TECHNETIUM TC 99M SESTAMIBI GENERIC - CARDIOLITE
30.0000 | Freq: Once | INTRAVENOUS | Status: AC | PRN
  Administered 2012-07-31: 30 via INTRAVENOUS

## 2012-07-31 MED ORDER — TECHNETIUM TC 99M SESTAMIBI GENERIC - CARDIOLITE
10.0000 | Freq: Once | INTRAVENOUS | Status: AC | PRN
  Administered 2012-07-31: 10 via INTRAVENOUS

## 2012-07-31 NOTE — Progress Notes (Signed)
Subjective:  Feels well, no CP, no SOB, no syncope  Objective:  Vital Signs in the last 24 hours: Temp:  [98.1 F (36.7 C)-98.3 F (36.8 C)] 98.3 F (36.8 C) (10/24 1400) Pulse Rate:  [65-116] 76  (10/24 1400) Resp:  [18] 18  (10/24 1400) BP: (109-159)/(52-83) 137/78 mmHg (10/24 1400) SpO2:  [94 %-96 %] 96 % (10/24 1400)  Intake/Output from previous day: 10/23 0701 - 10/24 0700 In: 480 [P.O.:480] Out: -    Physical Exam: General: Well developed, well nourished, in no acute distress. Head:  Normocephalic and atraumatic. Lungs: Clear to auscultation and percussion. Heart: Normal S1 and S2. Occas ectopy No murmur, rubs or gallops.  Abdomen: soft, non-tender, positive bowel sounds. Extremities: No clubbing or cyanosis. No edema. Neurologic: Alert and oriented x 3.    Lab Results:  Basename 07/30/12 0235 07/29/12 1920  WBC 5.5 5.3  HGB 14.1 14.2  PLT 171 166    Basename 07/30/12 0235 07/29/12 1920  NA 138 138  K 3.8 3.8  CL 103 102  CO2 27 26  GLUCOSE 118* 158*  BUN 12 13  CREATININE 0.70 0.70    Basename 07/30/12 1352 07/30/12 0859  TROPONINI <0.30 <0.30   Hepatic Function Panel  Basename 07/30/12 0235  PROT 7.0  ALBUMIN 3.4*  AST 26  ALT 22  ALKPHOS 85  BILITOT 0.5  BILIDIR --  IBILI --    Imaging: NUC - low risk, no ischemia.  Personally viewed.   Telemetry: Second degree HB type 1. First degree AVB intermitt.  Personally viewed.   Assessment/Plan:  Principal Problem:  *Chest pain Active Problems:  HYPERLIPIDEMIA  HYPERTENSION  GASTRIC ULCER, HX OF  AV block  Second degree AV block, Mobitz type I  First degree AV block  Coronary artery calcification  Incidental lung nodule, > 3mm and < 8mm   -No ischemia on NUC, reassuring -OK with discharge from CV perspective -Would advocate LDL of <100, 70 with Cors calcified.  -I do not think she needs a monitor to go home with currently. Adequate tele time here in hospital.  -I will see back  in one month in clinic for follow up.     Tracer Gutridge 07/31/2012, 6:01 PM

## 2012-07-31 NOTE — Progress Notes (Signed)
Subjective: Kristen Wagner is feeling well this morning.  No further chest pain or lightheadedness or dizziness and no shortness of breath.  Appreciate cardiac consultation per Dr. Lin Givens  Objective: Weight change:   Intake/Output Summary (Last 24 hours) at 07/31/12 0811 Last data filed at 07/30/12 1801  Gross per 24 hour  Intake    480 ml  Output      0 ml  Net    480 ml   Filed Vitals:   07/30/12 0500 07/30/12 1400 07/30/12 2100 07/31/12 0500  BP: 132/76 134/76 129/70 144/83  Pulse: 62 69 66 65  Temp: 97.8 F (36.6 C) 98.2 F (36.8 C) 98.1 F (36.7 C) 98.1 F (36.7 C)  TempSrc:  Oral Oral Oral  Resp: 18 18    Height:      Weight:      SpO2: 96% 96% 94% 96%   General Appearance: Alert, cooperative, no distress, appears stated age Head: Normocephalic, without obvious abnormality, atraumatic Neck: Supple, symmetrical Lungs: Clear to auscultation bilaterally, respirations unlabored Heart: Regular rate and rhythm, S1 and S2 normal, no murmur, rub or gallop.  Monitor still shows Wenkebach conduction Abdomen: Soft, non-tender, bowel sounds active all four quadrants, no masses, no organomegaly Extremities: Extremities normal, atraumatic, no cyanosis or edema Pulses: 2+ and symmetric all extremities Skin: Skin color, texture, turgor normal, no rashes or lesions Neuro: CNII-XII intact. Normal strength, sensation and reflexes throughout   Lab Results:  [facility] 07/30/12 0235 07/29/12 1920  NA 138 138  K 3.8 3.8  CL 103 102  CO2 27 26  GLUCOSE 118* 158*  BUN 12 13  CREATININE 0.70 0.70  CALCIUM 9.1 9.7  MG -- --  PHOS -- --    Basename 07/30/12 0235 07/29/12 1920  AST 26 27  ALT 22 24  ALKPHOS 85 88  BILITOT 0.5 0.4  PROT 7.0 7.5  ALBUMIN 3.4* 3.7    Basename 07/30/12 0235  LIPASE 23  AMYLASE --    Basename 07/30/12 0235 07/29/12 1920  WBC 5.5 5.3  NEUTROABS 2.4 --  HGB 14.1 14.2  HCT 41.2 41.7  MCV 94.9 94.1  PLT 171 166    Basename 07/30/12 1352  07/30/12 0859 07/30/12 0233  CKTOTAL -- -- --  CKMB -- -- --  CKMBINDEX -- -- --  TROPONINI <0.30 <0.30 <0.30   No components found with this basename: POCBNP:3  Basename 07/29/12 1920  DDIMER 0.75*   No results found for this basename: HGBA1C:2 in the last 72 hours No results found for this basename: CHOL:2,HDL:2,LDLCALC:2,TRIG:2,CHOLHDL:2,LDLDIRECT:2 in the last 72 hours  Basename 07/30/12 0235  TSH 0.880  T4TOTAL --  T3FREE --  THYROIDAB --   No results found for this basename: VITAMINB12:2,FOLATE:2,FERRITIN:2,TIBC:2,IRON:2,RETICCTPCT:2 in the last 72 hours  Studies/Results: Dg Chest 2 View  07/29/2012  *RADIOLOGY REPORT*  Clinical Data: Chest pain and pressure, lightheadedness  CHEST - 2 VIEW  Comparison: Chest x-ray of 07/04/2004  Findings: No active infiltrate or effusion is seen.  Mediastinal contours are stable.  Surgical clips overlie the left lower neck presumably due to prior thyroid surgery.  The heart is mildly enlarged and stable.  There are degenerative changes throughout the thoracic spine.  IMPRESSION: Stable chest x-ray.  No active lung disease.   Original Report Authenticated By: Juline Patch, M.D.    Ct Angio Chest Pe W/cm &/or Wo Cm  07/29/2012  *RADIOLOGY REPORT*  Clinical Data: Chest pain, elevated D-dimer  CT ANGIOGRAPHY CHEST  Technique:  Multidetector  CT imaging of the chest using the standard protocol during bolus administration of intravenous contrast. Multiplanar reconstructed images including MIPs were obtained and reviewed to evaluate the vascular anatomy.  Contrast: 80mL OMNIPAQUE IOHEXOL 350 MG/ML SOLN  Comparison: 07/29/2012 chest radiograph  Findings: Pulmonary arterial branches are patent.  Central airways are patent.  Mild peripheral reticular opacities may reflect atelectasis or scarring.  No confluent airspace opacity.  No pneumothorax.  5 mm nodule right lower lobe on image 79 of series 6.  Small hiatal hernia.  Postoperative changes of the left  lobe of the thyroid gland.  The right lobe is enlarged and heterogeneous echogenicity.  There is a punctate calcification as well.  Ectatic aorta with scattered atherosclerotic disease.  Heart size upper normal.  Coronary artery calcification.  No pleural or pericardial effusion.  No intrathoracic lymphadenopathy. Limited images through the upper abdomen show no acute finding.  Multilevel degenerative changes of the imaged spine. No acute or aggressive appearing osseous lesion. A disc osteophyte complex at T2-3 results in moderate central canal narrowing.  IMPRESSION: No pulmonary embolism.  Coronary artery calcification.  Small hiatal hernia.  Status post left thyroidectomy.  The right lobe is enlarged and heterogeneous echogenicity, containing a punctate calcification. Recommend thyroid ultrasound follow-up.  5 mm nodule right lower lobe. If the patient is at high risk for bronchogenic carcinoma, follow-up chest CT at 6-12 months is recommended.  If the patient is at low risk for bronchogenic carcinoma, follow-up chest CT at 12 months is recommended.  This recommendation follows the consensus statement: Guidelines for Management of Small Pulmonary Nodules Detected on CT Scans: A Statement from the Fleischner Society as published in Radiology 2005; 237:395-400.  Other findings as above.   Original Report Authenticated By: Waneta Martins, M.D.    Medications: Scheduled Meds:   . aspirin EC  81 mg Oral Daily  . influenza  inactive virus vaccine  0.5 mL Intramuscular Tomorrow-1000  . loratadine  10 mg Oral Daily  . pantoprazole  40 mg Oral Q1200  . sodium chloride  3 mL Intravenous Q12H  . sodium chloride  3 mL Intravenous Q12H  . zolpidem  10 mg Oral QHS   Continuous Infusions:  PRN Meds:.acetaminophen, acetaminophen, hydrALAZINE, ondansetron (ZOFRAN) IV, ondansetron  Assessment/Plan: Patient Active Problem List  Diagnosis  Date Noted  .  AV block - appreciate ongoing workup per Dr. Lin Givens 07/30/2012  .  Chest pain - cardiac enzymes negative.  Possibly GERD with esophageal spasm.  Continue PPI therapy 07/29/2012  .  HYPERLIPIDEMIA - aware  11/02/2009  .  DEPRESSION - stable  11/02/2009  .  HYPERTENSION - variable blood pressure, frequently up with anxiety  11/02/2009  .  ACID REFLUX DISEASE - continue PPI therapy  11/02/2009  .  DIVERTICULITIS, COLON  11/02/2009  .  IRRITABLE BOWEL SYNDROME - aware  11/02/2009  .  GASTRIC ULCER, HX OF - aware  Pulmonary nodule, approximately 5 millimeters - will followup with CT scan in 3-6 months  Thyroid calcifications - workup as outpatient.  Status post subtotal thyroidectomy     LOS: 2 days   Kristen Wagner 07/31/2012, 8:11 AM

## 2012-07-31 NOTE — Progress Notes (Signed)
  Echocardiogram 2D Echocardiogram has been performed.  Kristen Wagner 07/31/2012, 6:25 PM

## 2012-08-01 NOTE — Discharge Summary (Signed)
Physician Discharge Summary  NAME:Kristen Wagner  ZOX:[id number]  DOB: Aug 27, 1927   Admit date: 07/29/2012 Discharge date: 08/01/2012  Discharge Diagnoses:  Principal Problem: Chest pain - atypical.  Likely secondary to coronary ischemia.  Negative dobutamine nuclear stress test.  Currently asymptomatic Active Problems:  HYPERLIPIDEMIA  HYPERTENSION  GASTRIC ULCER, HX OF  AV block  Second degree AV block, Mobitz type I  First degree AV block  Coronary artery calcification  Incidental lung nodule, > 3mm and < 8mm  Discharge Physical Exam:  Patient is alert and oriented and ready for discharge.  Feels well.  Ambulating well  Filed Vitals:   07/31/12 1057 07/31/12 1400 07/31/12 2150 08/01/12 0622  BP: 115/52 137/78 124/77 117/76  Pulse: 116 76 65 80  Temp:  98.3 F (36.8 C) 98.3 F (36.8 C) 97.9 F (36.6 C)  TempSrc:   Oral Oral  Resp:  18 16 15   Height:      Weight:      SpO2:  96% 93% 92%    General: Well developed, well nourished, in no acute distress  Head: Eyes PERRLA, No xanthomas. Normal cephalic and atramatic  Lungs: Clear bilaterally to auscultation and percussion. Normal respiratory effort. No wheezes, no rales.  Heart: HRRR S1 S2 Pulses are 2+ & equal. No appreciable murmur, occasional ectopy noted. No carotid bruit. No JVD. No abdominal bruits.  Abdomen: Bowel sounds are positive, abdomen soft and non-tender without masses. No hepatosplenomegaly.  Msk: Back normal. Normal strength and tone for age.  Extremities: No clubbing, cyanosis or edema. DP +1, prior knee replacement surgery scars noted. Prior right shoulder scar noted.  Neuro: Alert and oriented X 3, non-focal, MAE x 4  GU: Deferred  Rectal: Deferred  Psych: Good affect, responds appropriately  Discharge Condition: Improved  Hospital Course:  Mrs. Kristen Wagner is a very pleasant 78 year old female with history of hypertension and GERD and anxiety and depression.  On the day of admission she was  ironing and felt a questionable dizzy spell and an extremely intense chest pressure, very heavy, associated with mild shortness of breath.  Her daughter took her to the emergency room for evaluation and she was admitted to rule out a cardiac process.  During the first 24 hours of admission, she developed type II she block heart block as well as type I AV block with PR intervals as long as 406 milliseconds. Dr. Lin Givens of [facility] cardiology was consulted and dobutamine nuclear stress test was negative for ischemia.  No significant arrhythmias were noted that were symptomatic.  Cardiac enzymes were negative x3.  She had a very minimally elevated d-dimer and she had a negative CT angiogram for pulmonary embolism.  Currently feeling well I will be discharged home with followup by cardiology in 4 weeks.  Other medical issues were stable during admission  Consults: Treatment Team:  Donato Schultz, MD  Disposition:  Improved at discharge.  Chest pain may have been secondary to esophageal spasm secondary to history of GERD     Medication List     As of 08/01/2012  7:41 AM    Continue these medications         acetaminophen 500 MG tablet   Commonly known as: TYLENOL   Take 500 mg by mouth every 6 (six) hours as needed. Arthritis pain      citalopram 40 MG tablet   Commonly known as: CELEXA   Take 40 mg by mouth daily.      dorzolamide-timolol 22.3-6.8  MG/ML ophthalmic solution   Commonly known as: COSOPT   Place 1 drop into both eyes 2 (two) times daily.      ibuprofen 600 MG tablet   Commonly known as: ADVIL,MOTRIN   Take 600 mg by mouth 3 (three) times daily as needed. For pain      metroNIDAZOLE 0.75 % gel   Commonly known as: METROGEL   Apply 1 application topically at bedtime.      multivitamin with minerals Tabs   Take 1 tablet by mouth daily.      pantoprazole 40 MG tablet   Commonly known as: PROTONIX   Take 40 mg by mouth daily.      valsartan 160 MG tablet   Commonly known  as: DIOVAN   Take 160 mg by mouth daily.      zolpidem 10 MG tablet   Commonly known as: AMBIEN   Take 10 mg by mouth at bedtime.           Follow-up Information    Call Pamella Pert, MD. (follow up with Dr Jacinto Halim, call his office tomorrow to be rechecked; continue an aspirin daily)    Contact information:   [address]  [place] [address] [phone]          Things to follow up in the outpatient setting: Monitor for dizziness or chest pain  Time coordinating discharge: 25 minutes  The results of significant diagnostics from this hospitalization (including imaging, microbiology, ancillary and laboratory) are listed below for reference.    Significant Diagnostic Studies: Dg Chest 2 View  07/29/2012  *RADIOLOGY REPORT*  Clinical Data: Chest pain and pressure, lightheadedness  CHEST - 2 VIEW  Comparison: Chest x-ray of 07/04/2004  Findings: No active infiltrate or effusion is seen.  Mediastinal contours are stable.  Surgical clips overlie the left lower neck presumably due to prior thyroid surgery.  The heart is mildly enlarged and stable.  There are degenerative changes throughout the thoracic spine.  IMPRESSION: Stable chest x-ray.  No active lung disease.   Original Report Authenticated By: Juline Patch, M.D.    Ct Angio Chest Pe W/cm &/or Wo Cm  07/29/2012  *RADIOLOGY REPORT*  Clinical Data: Chest pain, elevated D-dimer  CT ANGIOGRAPHY CHEST  Technique:  Multidetector CT imaging of the chest using the standard protocol during bolus administration of intravenous contrast. Multiplanar reconstructed images including MIPs were obtained and reviewed to evaluate the vascular anatomy.  Contrast: 80mL OMNIPAQUE IOHEXOL 350 MG/ML SOLN  Comparison: 07/29/2012 chest radiograph  Findings: Pulmonary arterial branches are patent.  Central airways are patent.  Mild peripheral reticular opacities may reflect atelectasis or scarring.  No confluent airspace opacity.  No  pneumothorax.  5 mm nodule right lower lobe on image 79 of series 6.  Small hiatal hernia.  Postoperative changes of the left lobe of the thyroid gland.  The right lobe is enlarged and heterogeneous echogenicity.  There is a punctate calcification as well.  Ectatic aorta with scattered atherosclerotic disease.  Heart size upper normal.  Coronary artery calcification.  No pleural or pericardial effusion.  No intrathoracic lymphadenopathy. Limited images through the upper abdomen show no acute finding.  Multilevel degenerative changes of the imaged spine. No acute or aggressive appearing osseous lesion. A disc osteophyte complex at T2-3 results in moderate central canal narrowing.  IMPRESSION: No pulmonary embolism.  Coronary artery calcification.  Small hiatal hernia.  Status post left thyroidectomy.  The right lobe is enlarged and  heterogeneous echogenicity, containing a punctate calcification. Recommend thyroid ultrasound follow-up.  5 mm nodule right lower lobe. If the patient is at high risk for bronchogenic carcinoma, follow-up chest CT at 6-12 months is recommended.  If the patient is at low risk for bronchogenic carcinoma, follow-up chest CT at 12 months is recommended.  This recommendation follows the consensus statement: Guidelines for Management of Small Pulmonary Nodules Detected on CT Scans: A Statement from the Fleischner Society as published in Radiology 2005; 237:395-400.  Other findings as above.   Original Report Authenticated By: Waneta Martins, M.D.    Nm Myocar Multi W/spect W/wall Motion / Ef  07/31/2012  *RADIOLOGY REPORT*  Clinical Data:  Chest pain.  LAD calcification on prior CT.  MYOCARDIAL IMAGING WITH SPECT (REST AND PHARMACOLOGIC-STRESS) GATED LEFT VENTRICULAR WALL MOTION STUDY LEFT VENTRICULAR EJECTION FRACTION  Standard myocardial SPECT imaging was performed after resting intravenous injection of 10. Tc-68m tetrofosmin.  Subsequently, intravenous infusion of Dobutamine was  performed under the supervision of cardiology staff.  At peak effect of the drug, 30. of Tc-7m tetrofosmin was injected intravenously and standard myocardial SPECT imaging was performed.  Quantitative gated imaging was also performed to evaluate left ventricular wall motion and estimated left ventricular ejection fraction.  Comparison:  CT of 07/29/2012.  Findings:  Rest images demonstrate normal ventricular cavity size. Mildly decreased uptake within the apical segment of the anterior septal wall.  Stress images demonstrate no areas of reversibility to suggest inducible ischemia.  Similar mildly decreased uptake within the anterior septal wall.  Evaluation of wall motion demonstrates normal left ventricular wall motion and thickening.  Ejection fraction is estimated at 75%.  End diastolic volume of 51 cc.  End systolic volume of  13 cc.  IMPRESSION:  1.  Mild fixed defect involving the apical segment of the anteroseptal wall.  This could represent  soft tissue attenuation or remote ischemia.  Normal motion in this area argues for a soft tissue attenuation defect. 2.  No areas of reversibility to suggest inducible ischemia. 3.  Normal left ventricular wall motion, with ejection fraction estimated at 75%.   Original Report Authenticated By: Consuello Bossier, M.D.     Microbiology: No results found for this or any previous visit (from the past 240 hour(s)).   Labs: Results for orders placed during the hospital encounter of 07/29/12  CBC      Component Value Range   WBC 5.3  4.0 - 10.5 K/uL   RBC 4.43  3.87 - 5.11 MIL/uL   Hemoglobin 14.2  12.0 - 15.0 g/dL   HCT 16.1  09.6 - 04.5 %   MCV 94.1  78.0 - 100.0 fL   MCH 32.1  26.0 - 34.0 pg   MCHC 34.1  30.0 - 36.0 g/dL   RDW 40.9  81.1 - 91.4 %   Platelets 166  150 - 400 K/uL  COMPREHENSIVE METABOLIC PANEL      Component Value Range   Sodium 138  135 - 145 mEq/L   Potassium 3.8  3.5 - 5.1 mEq/L   Chloride 102  96 - 112 mEq/L   CO2 26  19 - 32  mEq/L   Glucose, Bld 158 (*) 70 - 99 mg/dL   BUN 13  6 - 23 mg/dL   Creatinine, Ser 7.82  0.50 - 1.10 mg/dL   Calcium 9.7  8.4 - 95.6 mg/dL   Total Protein 7.5  6.0 - 8.3 g/dL   Albumin 3.7  3.5 -  5.2 g/dL   AST 27  0 - 37 U/L   ALT 24  0 - 35 U/L   Alkaline Phosphatase 88  39 - 117 U/L   Total Bilirubin 0.4  0.3 - 1.2 mg/dL   GFR calc non Af Amer 78 (*) >90 mL/min   GFR calc Af Amer >90  >90 mL/min  PROTIME-INR      Component Value Range   Prothrombin Time 13.0  11.6 - 15.2 seconds   INR 0.99  0.00 - 1.49  APTT      Component Value Range   aPTT 31  24 - 37 seconds  D-DIMER, QUANTITATIVE      Component Value Range   D-Dimer, Quant 0.75 (*) 0.00 - 0.48 ug/mL-FEU  TROPONIN I      Component Value Range   Troponin I <0.30  <0.30 ng/mL  TROPONIN I      Component Value Range   Troponin I <0.30  <0.30 ng/mL  TROPONIN I      Component Value Range   Troponin I <0.30  <0.30 ng/mL  TROPONIN I      Component Value Range   Troponin I <0.30  <0.30 ng/mL  COMPREHENSIVE METABOLIC PANEL      Component Value Range   Sodium 138  135 - 145 mEq/L   Potassium 3.8  3.5 - 5.1 mEq/L   Chloride 103  96 - 112 mEq/L   CO2 27  19 - 32 mEq/L   Glucose, Bld 118 (*) 70 - 99 mg/dL   BUN 12  6 - 23 mg/dL   Creatinine, Ser 4.09  0.50 - 1.10 mg/dL   Calcium 9.1  8.4 - 81.1 mg/dL   Total Protein 7.0  6.0 - 8.3 g/dL   Albumin 3.4 (*) 3.5 - 5.2 g/dL   AST 26  0 - 37 U/L   ALT 22  0 - 35 U/L   Alkaline Phosphatase 85  39 - 117 U/L   Total Bilirubin 0.5  0.3 - 1.2 mg/dL   GFR calc non Af Amer 78 (*) >90 mL/min   GFR calc Af Amer >90  >90 mL/min  LIPASE, BLOOD      Component Value Range   Lipase 23  11 - 59 U/L  CBC WITH DIFFERENTIAL      Component Value Range   WBC 5.5  4.0 - 10.5 K/uL   RBC 4.34  3.87 - 5.11 MIL/uL   Hemoglobin 14.1  12.0 - 15.0 g/dL   HCT 91.4  78.2 - 95.6 %   MCV 94.9  78.0 - 100.0 fL   MCH 32.5  26.0 - 34.0 pg   MCHC 34.2  30.0 - 36.0 g/dL   RDW 21.3  08.6 - 57.8 %    Platelets 171  150 - 400 K/uL   Neutrophils Relative 43  43 - 77 %   Neutro Abs 2.4  1.7 - 7.7 K/uL   Lymphocytes Relative 47 (*) 12 - 46 %   Lymphs Abs 2.6  0.7 - 4.0 K/uL   Monocytes Relative 7  3 - 12 %   Monocytes Absolute 0.4  0.1 - 1.0 K/uL   Eosinophils Relative 3  0 - 5 %   Eosinophils Absolute 0.1  0.0 - 0.7 K/uL   Basophils Relative 1  0 - 1 %   Basophils Absolute 0.0  0.0 - 0.1 K/uL  TSH      Component Value Range   TSH 0.880  0.350 -  4.500 uIU/mL     Signed: Hatem Cull NEVILL 08/01/2012, 7:41 AM

## 2013-01-29 ENCOUNTER — Other Ambulatory Visit: Payer: Self-pay

## 2013-01-29 DIAGNOSIS — Z1231 Encounter for screening mammogram for malignant neoplasm of breast: Secondary | ICD-10-CM

## 2013-02-17 ENCOUNTER — Ambulatory Visit
Admission: RE | Admit: 2013-02-17 | Discharge: 2013-02-17 | Disposition: A | Discharge status: Home / Self Care | Payer: Medicare Other | Source: Ambulatory Visit

## 2013-02-17 DIAGNOSIS — Z1231 Encounter for screening mammogram for malignant neoplasm of breast: Secondary | ICD-10-CM

## 2013-04-21 ENCOUNTER — Encounter: Payer: Medicare Other | Attending: Internal Medicine | Admitting: *Deleted

## 2013-04-21 VITALS — Ht 64.0 in | Wt 172.2 lb

## 2013-04-21 DIAGNOSIS — Z713 Dietary counseling and surveillance: Secondary | ICD-10-CM | POA: Insufficient documentation

## 2013-04-21 DIAGNOSIS — E119 Type 2 diabetes mellitus without complications: Secondary | ICD-10-CM | POA: Insufficient documentation

## 2013-04-27 ENCOUNTER — Encounter: Payer: Self-pay | Admitting: *Deleted

## 2013-04-29 NOTE — Progress Notes (Signed)
Patient was seen on 04/21/2013 for the first of a series of three diabetes self-management courses at the [facility].   Current HbA1c: 7.1% on 03/24/2013  The following learning objectives were met by the patient during this course:   Defines the role of glucose and insulin  Identifies type of diabetes and pathophysiology  Defines the diagnostic criteria for diabetes and prediabetes  States the risk factors for Type 2 Diabetes  States the symptoms of Type 2 Diabetes  Defines Type 2 Diabetes treatment goals  Defines Type 2 Diabetes treatment options  States the rationale for glucose monitoring  Identifies A1C, glucose targets, and testing times  Identifies proper sharps disposal  Defines the purpose of a diabetes food plan  Identifies carbohydrate food groups  Defines effects of carbohydrate foods on glucose levels  Identifies carbohydrate choices/grams/food labels  States benefits of physical activity and effect on glucose  Review of suggested activity guidelines  Handouts given during class include:  Type 2 Diabetes: Basics Book  My Food Plan Book  Food and Activity Log   Follow-Up Plan: Core Class 2

## 2014-01-05 ENCOUNTER — Emergency Department (HOSPITAL_COMMUNITY): Payer: Medicare Other

## 2014-01-05 ENCOUNTER — Observation Stay (HOSPITAL_COMMUNITY): Payer: Medicare Other

## 2014-01-05 ENCOUNTER — Observation Stay (HOSPITAL_COMMUNITY)
Admission: EM | Admit: 2014-01-05 | Discharge: 2014-01-06 | Disposition: A | Discharge status: Home / Self Care | Payer: Medicare Other | Attending: Internal Medicine | Admitting: Internal Medicine

## 2014-01-05 ENCOUNTER — Encounter (HOSPITAL_COMMUNITY): Payer: Self-pay | Admitting: Emergency Medicine

## 2014-01-05 DIAGNOSIS — Z888 Allergy status to other drugs, medicaments and biological substances status: Secondary | ICD-10-CM | POA: Insufficient documentation

## 2014-01-05 DIAGNOSIS — R072 Precordial pain: Principal | ICD-10-CM | POA: Insufficient documentation

## 2014-01-05 DIAGNOSIS — F3289 Other specified depressive episodes: Secondary | ICD-10-CM | POA: Insufficient documentation

## 2014-01-05 DIAGNOSIS — R200 Anesthesia of skin: Secondary | ICD-10-CM | POA: Diagnosis present

## 2014-01-05 DIAGNOSIS — R51 Headache: Secondary | ICD-10-CM | POA: Insufficient documentation

## 2014-01-05 DIAGNOSIS — R5383 Other fatigue: Secondary | ICD-10-CM

## 2014-01-05 DIAGNOSIS — K219 Gastro-esophageal reflux disease without esophagitis: Secondary | ICD-10-CM | POA: Insufficient documentation

## 2014-01-05 DIAGNOSIS — R209 Unspecified disturbances of skin sensation: Secondary | ICD-10-CM

## 2014-01-05 DIAGNOSIS — R269 Unspecified abnormalities of gait and mobility: Secondary | ICD-10-CM | POA: Insufficient documentation

## 2014-01-05 DIAGNOSIS — R5381 Other malaise: Secondary | ICD-10-CM | POA: Insufficient documentation

## 2014-01-05 DIAGNOSIS — Z79899 Other long term (current) drug therapy: Secondary | ICD-10-CM | POA: Insufficient documentation

## 2014-01-05 DIAGNOSIS — R0602 Shortness of breath: Secondary | ICD-10-CM | POA: Insufficient documentation

## 2014-01-05 DIAGNOSIS — R079 Chest pain, unspecified: Secondary | ICD-10-CM

## 2014-01-05 DIAGNOSIS — Z885 Allergy status to narcotic agent status: Secondary | ICD-10-CM | POA: Insufficient documentation

## 2014-01-05 DIAGNOSIS — R519 Headache, unspecified: Secondary | ICD-10-CM

## 2014-01-05 DIAGNOSIS — M1990 Unspecified osteoarthritis, unspecified site: Secondary | ICD-10-CM | POA: Insufficient documentation

## 2014-01-05 DIAGNOSIS — K589 Irritable bowel syndrome without diarrhea: Secondary | ICD-10-CM

## 2014-01-05 DIAGNOSIS — H538 Other visual disturbances: Secondary | ICD-10-CM | POA: Insufficient documentation

## 2014-01-05 DIAGNOSIS — F329 Major depressive disorder, single episode, unspecified: Secondary | ICD-10-CM | POA: Insufficient documentation

## 2014-01-05 DIAGNOSIS — I10 Essential (primary) hypertension: Secondary | ICD-10-CM | POA: Insufficient documentation

## 2014-01-05 DIAGNOSIS — E119 Type 2 diabetes mellitus without complications: Secondary | ICD-10-CM | POA: Insufficient documentation

## 2014-01-05 DIAGNOSIS — G479 Sleep disorder, unspecified: Secondary | ICD-10-CM | POA: Insufficient documentation

## 2014-01-05 DIAGNOSIS — Z8669 Personal history of other diseases of the nervous system and sense organs: Secondary | ICD-10-CM | POA: Insufficient documentation

## 2014-01-05 DIAGNOSIS — M129 Arthropathy, unspecified: Secondary | ICD-10-CM | POA: Insufficient documentation

## 2014-01-05 DIAGNOSIS — R42 Dizziness and giddiness: Secondary | ICD-10-CM | POA: Insufficient documentation

## 2014-01-05 LAB — CBC
HEMATOCRIT: 44.5 % (ref 36.0–46.0)
Hemoglobin: 15.4 g/dL — ABNORMAL HIGH (ref 12.0–15.0)
MCH: 32.2 pg (ref 26.0–34.0)
MCHC: 34.6 g/dL (ref 30.0–36.0)
MCV: 93.1 fL (ref 78.0–100.0)
Platelets: 187 10*3/uL (ref 150–400)
RBC: 4.78 MIL/uL (ref 3.87–5.11)
RDW: 13.7 % (ref 11.5–15.5)
WBC: 5.6 10*3/uL (ref 4.0–10.5)

## 2014-01-05 LAB — URINALYSIS, ROUTINE W REFLEX MICROSCOPIC
Bilirubin Urine: NEGATIVE
Glucose, UA: NEGATIVE mg/dL
Hgb urine dipstick: NEGATIVE
KETONES UR: NEGATIVE mg/dL
Leukocytes, UA: NEGATIVE
NITRITE: NEGATIVE
PH: 5.5 (ref 5.0–8.0)
Protein, ur: NEGATIVE mg/dL
SPECIFIC GRAVITY, URINE: 1.018 (ref 1.005–1.030)
Urobilinogen, UA: 0.2 mg/dL (ref 0.0–1.0)

## 2014-01-05 LAB — I-STAT TROPONIN, ED: Troponin i, poc: 0 ng/mL (ref 0.00–0.08)

## 2014-01-05 LAB — BASIC METABOLIC PANEL
BUN: 16 mg/dL (ref 6–23)
CHLORIDE: 101 meq/L (ref 96–112)
CO2: 24 mEq/L (ref 19–32)
CREATININE: 0.59 mg/dL (ref 0.50–1.10)
Calcium: 9.8 mg/dL (ref 8.4–10.5)
GFR calc non Af Amer: 82 mL/min — ABNORMAL LOW (ref >90)
GLUCOSE: 182 mg/dL — AB (ref 70–99)
Potassium: 4.2 mEq/L (ref 3.7–5.3)
Sodium: 138 mEq/L (ref 137–147)

## 2014-01-05 LAB — GLUCOSE, CAPILLARY: Glucose-Capillary: 144 mg/dL — ABNORMAL HIGH (ref 70–99)

## 2014-01-05 LAB — TROPONIN I

## 2014-01-05 LAB — SEDIMENTATION RATE: SED RATE: 13 mm/h (ref 0–22)

## 2014-01-05 LAB — PRO B NATRIURETIC PEPTIDE: Pro B Natriuretic peptide (BNP): 75.5 pg/mL (ref 0–450)

## 2014-01-05 MED ORDER — ZOLPIDEM TARTRATE 5 MG PO TABS: 10.0000 mg | ORAL_TABLET | Freq: Every evening | ORAL | Status: DC | PRN

## 2014-01-05 MED ORDER — ASPIRIN EC 325 MG PO TBEC
325.0000 mg | DELAYED_RELEASE_TABLET | Freq: Every day | ORAL | Status: DC
  Administered 2014-01-05 – 2014-01-06 (×2): 325 mg via ORAL
  Filled 2014-01-05 (×2): qty 1

## 2014-01-05 MED ORDER — ENOXAPARIN SODIUM 40 MG/0.4ML ~~LOC~~ SOLN
40.0000 mg | SUBCUTANEOUS | Status: DC
  Administered 2014-01-05: 40 mg via SUBCUTANEOUS
  Filled 2014-01-05 (×2): qty 0.4

## 2014-01-05 MED ORDER — INSULIN ASPART 100 UNIT/ML ~~LOC~~ SOLN: 0.0000 [IU] | Freq: Three times a day (TID) | SUBCUTANEOUS | Status: DC

## 2014-01-05 MED ORDER — MORPHINE SULFATE 2 MG/ML IJ SOLN: 2.0000 mg | INTRAMUSCULAR | Status: DC | PRN

## 2014-01-05 MED ORDER — ACETAMINOPHEN 500 MG PO TABS: 500.0000 mg | ORAL_TABLET | Freq: Four times a day (QID) | ORAL | Status: DC | PRN

## 2014-01-05 MED ORDER — PANTOPRAZOLE SODIUM 40 MG PO TBEC
40.0000 mg | DELAYED_RELEASE_TABLET | Freq: Two times a day (BID) | ORAL | Status: DC
  Administered 2014-01-05 – 2014-01-06 (×2): 40 mg via ORAL
  Filled 2014-01-05: qty 1

## 2014-01-05 MED ORDER — ZOLPIDEM TARTRATE 5 MG PO TABS
5.0000 mg | ORAL_TABLET | Freq: Every evening | ORAL | Status: DC | PRN
  Administered 2014-01-05: 5 mg via ORAL
  Filled 2014-01-05: qty 1

## 2014-01-05 MED ORDER — INSULIN ASPART 100 UNIT/ML ~~LOC~~ SOLN: 0.0000 [IU] | Freq: Every day | SUBCUTANEOUS | Status: DC

## 2014-01-05 MED ORDER — GI COCKTAIL ~~LOC~~: 30.0000 mL | Freq: Four times a day (QID) | ORAL | Status: DC | PRN

## 2014-01-05 MED ORDER — AMLODIPINE BESYLATE 5 MG PO TABS
5.0000 mg | ORAL_TABLET | Freq: Every day | ORAL | Status: DC
  Administered 2014-01-05 – 2014-01-06 (×2): 5 mg via ORAL
  Filled 2014-01-05 (×2): qty 1

## 2014-01-05 MED ORDER — SIMVASTATIN 20 MG PO TABS
20.0000 mg | ORAL_TABLET | Freq: Every day | ORAL | Status: DC
  Administered 2014-01-06: 20 mg via ORAL
  Filled 2014-01-05: qty 1

## 2014-01-05 NOTE — ED Notes (Signed)
Pt to CT at this time.

## 2014-01-05 NOTE — H&P (Signed)
[facility] History and Physical  Patient: Kristen Wagner  IJA:[id number]  DOB: 19-Feb-1927  DOS: the patient was seen and examined on 01/05/2014 PCP: Pearla Dubonnet, MD  Chief Complaint: Chest pain  HPI: Kristen Wagner is a 80 y.o. female with Past medical history of hypertension, arthritis, depression, diabetes mellitus, GERD. The patient presented with complaints of chest pain. She mentions that today around 11:00 he started having complaints of blurring of her vision. She mentions she has chronic vision loss in her left eye but her right eye was also blurring today. With that she started having some frontal headache. This was followed by a substernal chest pain which was felt like a heaviness and associated with shortness of breath. She called her PCP who recommended her to come to the hospital. She complains about dizziness but denies any vertigo. She denies any fever or chills denies any cough or congestion, denies any recent change in her medications, denies any nausea or vomiting diarrhea or constipation or burning urination. She denies any swelling of her legs. She denies any recent change in her medications.  The patient is coming from home. And at her baseline fairlyIndependent for most of her  ADL.  Review of Systems: as mentioned in the history of present illness.  A Comprehensive review of the other systems is negative.  Past Medical History  Diagnosis Date  . Hypertension   . Arthritis   . Depression   . Diabetes mellitus without complication   . GERD (gastroesophageal reflux disease)   . Osteoarthritis   . Insomnia   . Personal history of calcium pyrophosphate deposition disease (CPPD)     wrists and knees  . Osteoarthritis   . Diverticulosis   . Diverticulitis   . Actinic keratoses   . Fibromyalgia   . Lung nodule 2013    5 cm   Past Surgical History  Procedure Laterality Date  . Thyroid surgery    . Shoulder surgery    . Back surgery    .  Abdominal hysterectomy    . Cesarean section    . Knee surgery    . Tonsillectomy    . Cholecystectomy    . Eye surgery    . Appendectomy    . Foot neuroma surgery     Social History:  reports that she has never smoked. She does not have any smokeless tobacco history on file. She reports that she drinks alcohol. She reports that she does not use illicit drugs.  Allergies  Allergen Reactions  . Codeine Nausea And Vomiting  . Hctz [Hydrochlorothiazide]     hives  . Iodine     hives  . Iohexol Itching  . Prednisone Nausea And Vomiting    And headache    Family History  Problem Relation Age of Onset  . Stroke Mother   . Heart disease Mother   . AAA (abdominal aortic aneurysm) Father     Prior to Admission medications   Medication Sig Start Date End Date Taking? Authorizing Provider  acetaminophen (TYLENOL) 500 MG tablet Take 500 mg by mouth every 6 (six) hours as needed. Arthritis pain   Yes Historical Provider, MD  amLODipine (NORVASC) 5 MG tablet Take 5 mg by mouth daily.   Yes Historical Provider, MD  calcium carbonate (OS-CAL) 600 MG TABS tablet Take 600 mg by mouth 2 (two) times daily with a meal.   Yes Historical Provider, MD  cetirizine (ZYRTEC) 10 MG tablet Take 10 mg by mouth daily.  Yes Historical Provider, MD  Cholecalciferol (VITAMIN D3) 2000 UNITS TABS Take by mouth.   Yes Historical Provider, MD  GABAPENTIN PO Take by mouth 2 (two) times daily.   Yes Historical Provider, MD  ibuprofen (ADVIL,MOTRIN) 600 MG tablet Take 600 mg by mouth every 6 (six) hours as needed.   Yes Historical Provider, MD  KETOCONAZOLE PO Take by mouth. 2% cream   Yes Historical Provider, MD  losartan (COZAAR) 100 MG tablet Take 100 mg by mouth daily.   Yes Historical Provider, MD  metFORMIN (GLUCOPHAGE) 500 MG tablet Take by mouth 2 (two) times daily with a meal.   Yes Historical Provider, MD  metroNIDAZOLE (METROGEL) 0.75 % gel Apply 1 application topically 2 (two) times daily.   Yes  Historical Provider, MD  ondansetron (ZOFRAN) 4 MG tablet Take 4 mg by mouth every 8 (eight) hours as needed for nausea or vomiting.   Yes Historical Provider, MD  pantoprazole (PROTONIX) 40 MG tablet Take 40 mg by mouth daily.   Yes Historical Provider, MD  simvastatin (ZOCOR) 20 MG tablet Take 20 mg by mouth daily.   Yes Historical Provider, MD  zolpidem (AMBIEN) 10 MG tablet Take 10 mg by mouth at bedtime as needed for sleep.   Yes Historical Provider, MD    Physical Exam: Filed Vitals:   01/05/14 1405  BP: 165/91  Pulse: 86  Temp: 98.6 F (37 C)  TempSrc: Oral  SpO2: 95%    General: Alert, Awake and Oriented to Time, Place and Person. Appear in mild distress Eyes: PERRL ENT: Oral Mucosa clear moist. Neck: no JVD Cardiovascular: S1 and S2 Present, no Murmur, Peripheral Pulses Present Respiratory: Bilateral Air entry equal and Decreased, Clear to Auscultation,  no Crackles,no wheezes Abdomen: Bowel Sound Present, Soft and Non tender Skin: no Rash Extremities: no Pedal edema, no calf tenderness Neurologic:  Left-sided sensory numbness otherwise Grossly Unremarkable. No temporal tenderness. Frontal sinus tenderness present.  Labs on Admission:  CBC:  Recent Labs Lab 01/05/14 1407  WBC 5.6  HGB 15.4*  HCT 44.5  MCV 93.1  PLT 187    CMP     Component Value Date/Time   NA 138 01/05/2014 1407   K 4.2 01/05/2014 1407   CL 101 01/05/2014 1407   CO2 24 01/05/2014 1407   GLUCOSE 182* 01/05/2014 1407   BUN 16 01/05/2014 1407   CREATININE 0.59 01/05/2014 1407   CALCIUM 9.8 01/05/2014 1407   PROT 7.0 07/30/2012 0235   ALBUMIN 3.4* 07/30/2012 0235   AST 26 07/30/2012 0235   ALT 22 07/30/2012 0235   ALKPHOS 85 07/30/2012 0235   BILITOT 0.5 07/30/2012 0235   GFRNONAA 82* 01/05/2014 1407   GFRAA >90 01/05/2014 1407    No results found for this basename: LIPASE, AMYLASE,  in the last 168 hours No results found for this basename: AMMONIA,  in the last 168 hours  No results  found for this basename: CKTOTAL, CKMB, CKMBINDEX, TROPONINI,  in the last 168 hours BNP (last 3 results) No results found for this basename: PROBNP,  in the last 8760 hours  Radiological Exams on Admission: Dg Chest 2 View  01/05/2014   CLINICAL DATA:  Chest pain, shortness of breath, hypertension  EXAM: CHEST  2 VIEW  COMPARISON:  Prior radiograph from 07/29/2012  FINDINGS: Mild cardiomegaly is stable as compared to prior exam. Surgical clips overlie the lower left neck, unchanged.  The lungs are normally inflated. No airspace consolidation, pleural effusion, or pulmonary edema  is identified. There is no pneumothorax.  No acute osseous abnormality identified. Degenerative changes within the visualized spine again noted, unchanged.  IMPRESSION: Stable cardiomegaly.  No acute cardiopulmonary abnormality.   Electronically Signed   By: Jeannine Boga M.D.   On: 01/05/2014 14:36   Ct Head Wo Contrast  01/05/2014   CLINICAL DATA:  Headache.  Blurred vision.  EXAM: CT HEAD WITHOUT CONTRAST  TECHNIQUE: Contiguous axial images were obtained from the base of the skull through the vertex without intravenous contrast.  COMPARISON:  03/23/2010.  FINDINGS: No intracranial hemorrhage.  Small vessel disease type changes without CT evidence of large acute infarct.  No intracranial mass lesion noted on this unenhanced exam.  No hydrocephalus.  Vascular calcifications.  Transverse ligament hypertrophy with associated calcifications contributes to spinal stenosis and mild cord flattening.  Prior cataract surgery otherwise orbital structures unremarkable.  IMPRESSION: No intracranial hemorrhage.  Small vessel disease type changes without CT evidence of large acute infarct.  Transverse ligament hypertrophy with associated calcifications contributes to spinal stenosis and mild cord flattening.   Electronically Signed   By: Chauncey Cruel M.D.   On: 01/05/2014 17:24    EKG: Independently reviewed. normal sinus rhythm,  nonspecific ST and T waves changes.  Assessment/Plan Principal Problem:   Chest pain Active Problems:   DEPRESSION   HYPERTENSION   ACID REFLUX DISEASE   Frontal headache   Blurred vision   Left sided numbness   1. Chest pain The patient is presenting with complaints of chest pain. Her initial troponin and EKG are negative for any acute abnormality. She is not at significant risk for any DVT and is not hypoxic or tachycardic or hypotensive. Chest x-ray is clear for any acute abnormality as well. With this the patient will be admitted to the hospital for serial troponins and telemetry monitoring. I will get an echocardiogram in the morning. I will keep the patient n.p.o. For possible stress test in the morning.  2.left-sided numbness, blurred vision Initial CT of the head is negative. At present neurologic has been consulted. Patient's ESR is normal and she does not have any temporal tenderness which makes possibility of temporal arteritis less likely. We will follow neurological recommendation. I will obtain an MRI of the brainfor further workup. Continue aspirin Serial neuro checks, telemetry monitoring  3.hypertension Continue home antihypertensive medications  4.acid reflux Continue Protonix   Consults: neurologically  DVT Prophylaxis: subcutaneous Heparin Nutrition: n.p.o. After midnight otherwise cardiac diet  Code Status: full  Family Communication: daughter was present at bedside, opportunity was given to ask question and all questions were answered satisfactorily at the time of interview. Disposition: Admitted to observation in telemetry unit.  Author: Berle Mull, MD [facility] Pager: [phone] 01/05/2014, 8:54 PM    If 7PM-7AM, please contact night-coverage [email or web address] Password [email or web address]

## 2014-01-05 NOTE — ED Notes (Signed)
Pt's daughter  Laurie PandaWendi Loveless  [phone]

## 2014-01-05 NOTE — ED Notes (Signed)
Pt returned from CT, placed back on cardiac monitor.  Family at bedside.

## 2014-01-05 NOTE — ED Notes (Signed)
Urine obtained and sent to lab  

## 2014-01-05 NOTE — ED Notes (Signed)
Dr. Patel at bedside to assess pt.

## 2014-01-05 NOTE — ED Notes (Signed)
Pt st's she had chest tightness earlier but denies any pain or discomfort now.  Pt also c/o headache and blurry vision off and on for 1-2 weeks.

## 2014-01-05 NOTE — ED Notes (Signed)
Pt reports central chest pressure since AM, denies radation. Reports mild SOB (worse with exertion), and dizziness. Reports pain 3/10. VSS. NAD.

## 2014-01-05 NOTE — ED Provider Notes (Signed)
CSN: [id number]     Arrival date & time 01/05/14  1354 History   First MD Initiated Contact with Patient 01/05/14 1634     Chief Complaint  Patient presents with  . Chest Pain     (Consider location/radiation/quality/duration/timing/severity/associated sxs/prior Treatment) Patient is a 80 y.o. female presenting with chest pain. The history is provided by the patient. No language interpreter was used.  Chest Pain Pain location:  Substernal area Pain quality: pressure   Pain radiates to:  Does not radiate Pain radiates to the back: no   Pain severity:  Moderate Onset quality:  Sudden Duration:  6 hours Timing:  Intermittent Progression:  Waxing and waning Chronicity:  New Context: at rest   Associated symptoms: dizziness, fatigue, headache, shortness of breath and weakness   Headaches:    Severity:  Moderate   Onset quality:  Gradual   Progression:  Partially resolved   Past Medical History  Diagnosis Date  . Hypertension   . Arthritis   . Depression   . Diabetes mellitus without complication   . GERD (gastroesophageal reflux disease)   . Osteoarthritis   . Insomnia   . Personal history of calcium pyrophosphate deposition disease (CPPD)     wrists and knees  . Osteoarthritis   . Diverticulosis   . Diverticulitis   . Actinic keratoses   . Fibromyalgia   . Lung nodule 2013    5 cm   Past Surgical History  Procedure Laterality Date  . Thyroid surgery    . Shoulder surgery    . Back surgery    . Abdominal hysterectomy    . Cesarean section    . Knee surgery    . Tonsillectomy    . Cholecystectomy    . Eye surgery    . Appendectomy    . Foot neuroma surgery     Family History  Problem Relation Age of Onset  . Stroke Mother   . Heart disease Mother   . AAA (abdominal aortic aneurysm) Father    History  Substance Use Topics  . Smoking status: Never Smoker   . Smokeless tobacco: Not on file  . Alcohol Use: Yes     Comment: rarely   OB History   Grav  Para Term Preterm Abortions TAB SAB Ect Mult Living                 Review of Systems  Constitutional: Positive for fatigue.  Respiratory: Positive for shortness of breath.   Cardiovascular: Positive for chest pain.  Neurological: Positive for dizziness, weakness and headaches.  All other systems reviewed and are negative.      Allergies  Codeine; Hctz; Iodine; Iohexol; and Prednisone  Home Medications   Current Outpatient Rx  Name  Route  Sig  Dispense  Refill  . acetaminophen (TYLENOL) 500 MG tablet   Oral   Take 500 mg by mouth every 6 (six) hours as needed. Arthritis pain         . amLODipine (NORVASC) 5 MG tablet   Oral   Take 5 mg by mouth daily.         . calcium carbonate (OS-CAL) 600 MG TABS tablet   Oral   Take 600 mg by mouth 2 (two) times daily with a meal.         . cetirizine (ZYRTEC) 10 MG tablet   Oral   Take 10 mg by mouth daily.         . Cholecalciferol (VITAMIN  D3) 2000 UNITS TABS   Oral   Take by mouth.         Marland Kitchen ibuprofen (ADVIL,MOTRIN) 600 MG tablet   Oral   Take 600 mg by mouth every 6 (six) hours as needed.         Marland Kitchen KETOCONAZOLE PO   Oral   Take by mouth. 2% cream         . losartan (COZAAR) 100 MG tablet   Oral   Take 100 mg by mouth daily.         . metFORMIN (GLUCOPHAGE) 500 MG tablet   Oral   Take by mouth 2 (two) times daily with a meal.         . metroNIDAZOLE (METROGEL) 0.75 % gel   Topical   Apply 1 application topically 2 (two) times daily.         . ondansetron (ZOFRAN) 4 MG tablet   Oral   Take 4 mg by mouth every 8 (eight) hours as needed for nausea or vomiting.         . pantoprazole (PROTONIX) 40 MG tablet   Oral   Take 40 mg by mouth daily.         . sertraline (ZOLOFT) 100 MG tablet   Oral   Take 100 mg by mouth daily.         . simvastatin (ZOCOR) 20 MG tablet   Oral   Take 20 mg by mouth daily.         Marland Kitchen zolpidem (AMBIEN) 10 MG tablet   Oral   Take 10 mg by mouth at  bedtime as needed for sleep.          BP 165/91  Pulse 86  Temp(Src) 98.6 F (37 C) (Oral)  SpO2 95% Physical Exam  Nursing note and vitals reviewed. Constitutional: She is oriented to person, place, and time. She appears well-developed and well-nourished.  HENT:  Head: Normocephalic.  Eyes: EOM are normal.  Neck: Normal range of motion.  Cardiovascular: Normal rate and regular rhythm.   Pulmonary/Chest: Effort normal and breath sounds normal.  Abdominal: Soft. Bowel sounds are normal.  Musculoskeletal: Normal range of motion. She exhibits no edema and no tenderness.  Lymphadenopathy:    She has no cervical adenopathy.  Neurological: She is alert and oriented to person, place, and time. She has normal strength. No cranial nerve deficit. Coordination normal. GCS eye subscore is 4. GCS verbal subscore is 5. GCS motor subscore is 6.  Skin: Skin is warm and dry.  Psychiatric: She has a normal mood and affect. Her behavior is normal. Judgment and thought content normal.    ED Course  Procedures (including critical care time) Labs Review Labs Reviewed  CBC - Abnormal; Notable for the following:    Hemoglobin 15.4 (*)    All other components within normal limits  BASIC METABOLIC PANEL - Abnormal; Notable for the following:    Glucose, Bld 182 (*)    GFR calc non Af Amer 82 (*)    All other components within normal limits  PRO B NATRIURETIC PEPTIDE  I-STAT TROPOININ, ED   Imaging Review Dg Chest 2 View  01/05/2014   CLINICAL DATA:  Chest pain, shortness of breath, hypertension  EXAM: CHEST  2 VIEW  COMPARISON:  Prior radiograph from 07/29/2012  FINDINGS: Mild cardiomegaly is stable as compared to prior exam. Surgical clips overlie the lower left neck, unchanged.  The lungs are normally inflated. No airspace consolidation, pleural  effusion, or pulmonary edema is identified. There is no pneumothorax.  No acute osseous abnormality identified. Degenerative changes within the  visualized spine again noted, unchanged.  IMPRESSION: Stable cardiomegaly.  No acute cardiopulmonary abnormality.   Electronically Signed   By: Rise Mu M.D.   On: 01/05/2014 14:36     EKG Interpretation None     Chest pain, currently resolved. Patient endorses history of mid-sternal chest pain with shortness of breath and weakness today.  Headache with blurred vision and unsteady gait, but otherwise normal neuro exam. CT of head without acute abnormality. ECG without ischemic changes. Normal troponin. Patient discussed with and seen by Dr. Criss Alvine.  Consult with neurology.  Admit to medicine for ACS rule/out. MDM   Final diagnoses:  None   Chest pain. Headache with blurred vision.   Jimmye Norman, NP 01/06/14 [phone]

## 2014-01-05 NOTE — ED Notes (Signed)
Pt to MRI at this time.

## 2014-01-05 NOTE — ED Notes (Signed)
Pt continues to c/o headache.

## 2014-01-06 ENCOUNTER — Observation Stay (HOSPITAL_COMMUNITY): Payer: Medicare Other

## 2014-01-06 ENCOUNTER — Other Ambulatory Visit: Payer: Self-pay

## 2014-01-06 DIAGNOSIS — H538 Other visual disturbances: Secondary | ICD-10-CM

## 2014-01-06 DIAGNOSIS — I517 Cardiomegaly: Secondary | ICD-10-CM

## 2014-01-06 DIAGNOSIS — R51 Headache: Secondary | ICD-10-CM

## 2014-01-06 DIAGNOSIS — R079 Chest pain, unspecified: Secondary | ICD-10-CM

## 2014-01-06 DIAGNOSIS — H5310 Unspecified subjective visual disturbances: Secondary | ICD-10-CM

## 2014-01-06 LAB — GLUCOSE, CAPILLARY
GLUCOSE-CAPILLARY: 132 mg/dL — AB (ref 70–99)
Glucose-Capillary: 155 mg/dL — ABNORMAL HIGH (ref 70–99)

## 2014-01-06 LAB — C-REACTIVE PROTEIN: CRP: <0.5 mg/dL — ABNORMAL LOW (ref <0.60)

## 2014-01-06 LAB — TROPONIN I

## 2014-01-06 MED ORDER — ASPIRIN 325 MG PO TBEC: 325.0000 mg | DELAYED_RELEASE_TABLET | Freq: Every day | ORAL | Status: AC

## 2014-01-06 MED ORDER — REGADENOSON 0.4 MG/5ML IV SOLN
INTRAVENOUS | Status: AC
  Administered 2014-01-06: 0.4 mg via INTRAVENOUS
  Filled 2014-01-06: qty 5

## 2014-01-06 MED ORDER — SODIUM CHLORIDE 0.9 % IJ SOLN
80.0000 mg | INTRAVENOUS | Status: AC
  Administered 2014-01-06: 80 mg via INTRAVENOUS

## 2014-01-06 MED ORDER — TECHNETIUM TC 99M SESTAMIBI GENERIC - CARDIOLITE
30.0000 | Freq: Once | INTRAVENOUS | Status: AC | PRN
  Administered 2014-01-06: 30 via INTRAVENOUS

## 2014-01-06 MED ORDER — TECHNETIUM TC 99M SESTAMIBI GENERIC - CARDIOLITE
10.0000 | Freq: Once | INTRAVENOUS | Status: AC | PRN
  Administered 2014-01-06: 10 via INTRAVENOUS

## 2014-01-06 MED ORDER — REGADENOSON 0.4 MG/5ML IV SOLN
0.4000 mg | Freq: Once | INTRAVENOUS | Status: AC
  Administered 2014-01-06: 0.4 mg via INTRAVENOUS
  Filled 2014-01-06: qty 5

## 2014-01-06 MED ORDER — SERTRALINE HCL 100 MG PO TABS: 100.0000 mg | ORAL_TABLET | Freq: Every day | ORAL | Status: AC

## 2014-01-06 NOTE — Progress Notes (Signed)
Subjective: Patient is feeling better today.  She feels like her vision in her right eye is somewhat improved and she is not having chest pain currently.  Objective: Weight change:  No intake or output data in the 24 hours ending 01/06/14 0758 Filed Vitals:   01/05/14 2100 01/05/14 2243 01/05/14 2343 01/06/14 0400  BP: 166/78 161/84 138/72 134/83  Pulse: 87 76 76 90  Temp:  98.2 F (36.8 C) 98.5 F (36.9 C) 98.2 F (36.8 C)  TempSrc:   Oral Oral  Resp: $Remo'18 18 18 18  'XKFze$ Height:  $Remove'5\' 5"'GTGvHtw$  (1.651 m)    Weight:  171 lb 12.8 oz (77.928 kg)  171 lb 12.8 oz (77.928 kg)  SpO2: 97% 95% 95% 97%    General Appearance: Alert, cooperative, no distress, appears stated age Lungs: Clear to auscultation bilaterally, respirations unlabored Heart: Regular rate and rhythm, S1 and S2 normal, no murmur, rub or gallop Abdomen: Soft, non-tender, bowel sounds active all four quadrants, no masses, no organomegaly Extremities: Extremities normal, atraumatic, no cyanosis or edema Neuro: Chronically decreased vision in the left eye.  Right eye "back to normal" according to the patient.  Otherwise nonfocal.  Did not ambulate patient  Lab Results: Results for orders placed during the hospital encounter of 01/05/14 (from the past 48 hour(s))  CBC     Status: Abnormal   Collection Time    01/05/14  2:07 PM      Result Value Ref Range   WBC 5.6  4.0 - 10.5 K/uL   RBC 4.78  3.87 - 5.11 MIL/uL   Hemoglobin 15.4 (*) 12.0 - 15.0 g/dL   HCT 44.5  36.0 - 46.0 %   MCV 93.1  78.0 - 100.0 fL   MCH 32.2  26.0 - 34.0 pg   MCHC 34.6  30.0 - 36.0 g/dL   RDW 13.7  11.5 - 15.5 %   Platelets 187  150 - 400 K/uL  BASIC METABOLIC PANEL     Status: Abnormal   Collection Time    01/05/14  2:07 PM      Result Value Ref Range   Sodium 138  137 - 147 mEq/L   Potassium 4.2  3.7 - 5.3 mEq/L   Chloride 101  96 - 112 mEq/L   CO2 24  19 - 32 mEq/L   Glucose, Bld 182 (*) 70 - 99 mg/dL   BUN 16  6 - 23 mg/dL   Creatinine, Ser 0.59   0.50 - 1.10 mg/dL   Calcium 9.8  8.4 - 10.5 mg/dL   GFR calc non Af Amer 82 (*) >90 mL/min   GFR calc Af Amer >90  >90 mL/min   Comment: (NOTE)     The eGFR has been calculated using the CKD EPI equation.     This calculation has not been validated in all clinical situations.     eGFR's persistently <90 mL/min signify possible Chronic Kidney     Disease.  PRO B NATRIURETIC PEPTIDE     Status: None   Collection Time    01/05/14  2:07 PM      Result Value Ref Range   Pro B Natriuretic peptide (BNP) 75.5  0 - 450 pg/mL  SEDIMENTATION RATE     Status: None   Collection Time    01/05/14  2:07 PM      Result Value Ref Range   Sed Rate 13  0 - 22 mm/hr  I-STAT TROPOININ, ED     Status:  None   Collection Time    01/05/14  2:26 PM      Result Value Ref Range   Troponin i, poc 0.00  0.00 - 0.08 ng/mL   Comment 3            Comment: Due to the release kinetics of cTnI,     a negative result within the first hours     of the onset of symptoms does not rule out     myocardial infarction with certainty.     If myocardial infarction is still suspected,     repeat the test at appropriate intervals.  URINALYSIS, ROUTINE W REFLEX MICROSCOPIC     Status: None   Collection Time    01/05/14  6:24 PM      Result Value Ref Range   Color, Urine YELLOW  YELLOW   APPearance CLEAR  CLEAR   Specific Gravity, Urine 1.018  1.005 - 1.030   pH 5.5  5.0 - 8.0   Glucose, UA NEGATIVE  NEGATIVE mg/dL   Hgb urine dipstick NEGATIVE  NEGATIVE   Bilirubin Urine NEGATIVE  NEGATIVE   Ketones, ur NEGATIVE  NEGATIVE mg/dL   Protein, ur NEGATIVE  NEGATIVE mg/dL   Urobilinogen, UA 0.2  0.0 - 1.0 mg/dL   Nitrite NEGATIVE  NEGATIVE   Leukocytes, UA NEGATIVE  NEGATIVE   Comment: MICROSCOPIC NOT DONE ON URINES WITH NEGATIVE PROTEIN, BLOOD, LEUKOCYTES, NITRITE, OR GLUCOSE <1000 mg/dL.  TROPONIN I     Status: None   Collection Time    01/05/14  8:47 PM      Result Value Ref Range   Troponin I <0.30  <0.30 ng/mL    Comment:            Due to the release kinetics of cTnI,     a negative result within the first hours     of the onset of symptoms does not rule out     myocardial infarction with certainty.     If myocardial infarction is still suspected,     repeat the test at appropriate intervals.  GLUCOSE, CAPILLARY     Status: Abnormal   Collection Time    01/05/14 11:04 PM      Result Value Ref Range   Glucose-Capillary 144 (*) 70 - 99 mg/dL   Comment 1 Notify RN    TROPONIN I     Status: None   Collection Time    01/06/14  4:45 AM      Result Value Ref Range   Troponin I <0.30  <0.30 ng/mL   Comment:            Due to the release kinetics of cTnI,     a negative result within the first hours     of the onset of symptoms does not rule out     myocardial infarction with certainty.     If myocardial infarction is still suspected,     repeat the test at appropriate intervals.  GLUCOSE, CAPILLARY     Status: Abnormal   Collection Time    01/06/14  7:33 AM      Result Value Ref Range   Glucose-Capillary 155 (*) 70 - 99 mg/dL   Comment 1 Documented in Chart     Comment 2 Notify RN      Studies/Results: Dg Chest 2 View  01/05/2014   CLINICAL DATA:  Chest pain, shortness of breath, hypertension  EXAM: CHEST  2 VIEW  COMPARISON:  Prior radiograph from 07/29/2012  FINDINGS: Mild cardiomegaly is stable as compared to prior exam. Surgical clips overlie the lower left neck, unchanged.  The lungs are normally inflated. No airspace consolidation, pleural effusion, or pulmonary edema is identified. There is no pneumothorax.  No acute osseous abnormality identified. Degenerative changes within the visualized spine again noted, unchanged.  IMPRESSION: Stable cardiomegaly.  No acute cardiopulmonary abnormality.   Electronically Signed   By: Jeannine Boga M.D.   On: 01/05/2014 14:36   Ct Head Wo Contrast  01/05/2014   CLINICAL DATA:  Headache.  Blurred vision.  EXAM: CT HEAD WITHOUT CONTRAST   TECHNIQUE: Contiguous axial images were obtained from the base of the skull through the vertex without intravenous contrast.  COMPARISON:  03/23/2010.  FINDINGS: No intracranial hemorrhage.  Small vessel disease type changes without CT evidence of large acute infarct.  No intracranial mass lesion noted on this unenhanced exam.  No hydrocephalus.  Vascular calcifications.  Transverse ligament hypertrophy with associated calcifications contributes to spinal stenosis and mild cord flattening.  Prior cataract surgery otherwise orbital structures unremarkable.  IMPRESSION: No intracranial hemorrhage.  Small vessel disease type changes without CT evidence of large acute infarct.  Transverse ligament hypertrophy with associated calcifications contributes to spinal stenosis and mild cord flattening.   Electronically Signed   By: Chauncey Cruel M.D.   On: 01/05/2014 17:24   [name] Contrast  01/05/2014   CLINICAL DATA:  Headache and blurred vision intermittently for 1-2 weeks.  EXAM: MRI HEAD WITHOUT CONTRAST  TECHNIQUE: Multiplanar, multiecho pulse sequences of the brain and surrounding structures were obtained without intravenous contrast.  COMPARISON:  CT HEAD W/O CM dated 01/05/2014  FINDINGS: No reduced diffusion to suggest acute ischemia. No susceptibility artifact to suggest hemorrhage.  The ventricles and sulci are normal for patient's age. Patchy supratentorial white matter and to lesser extent pontine T2 hyperintensities are less than expected for age. No midline shift or mass effect.  No abnormal extra-axial fluid collections. Normal major intracranial vascular flow voids seen at the skull base.  Status post bilateral ocular lens implants. Ocular globes and orbital contents are nonsuspicious though not tailored for evaluation. Paranasal sinuses and mastoid air cells are well aerated. No abnormal sellar expansion. Degenerative change of the odontoid process with surrounding pannus likely reflects CPPD, mildly  narrowing the cerebral spinal fluid space. No cerebellar tonsillar ectopia. Moderate temporomandibular osteoarthrosis.  IMPRESSION: No acute intracranial process.  Normal noncontrast MRI of the brain for age: Involutional changes. Mild to moderate white matter changes suggest chronic small vessel ischemic disease.   Electronically Signed   By: Elon Alas   On: 01/05/2014 22:37   Medications: Scheduled Meds: . amLODipine  5 mg Oral Daily  . aspirin EC  325 mg Oral Daily  . enoxaparin (LOVENOX) injection  40 mg Subcutaneous Q24H  . insulin aspart  0-5 Units Subcutaneous QHS  . insulin aspart  0-9 Units Subcutaneous TID WC  . pantoprazole  40 mg Oral BID AC  . simvastatin  20 mg Oral q1800   Continuous Infusions:  PRN Meds:.acetaminophen, gi cocktail, morphine injection, zolpidem  Assessment/Plan: Principal Problem:   Chest pain Active Problems:   DEPRESSION   HYPERTENSION   ACID REFLUX DISEASE   Frontal headache   Blurred vision   Left sided numbness  1. Chest pain - troponins are negative.  Check 2-D echocardiogram.  Agree with cardiology evaluation 2. Left-sided numbness, blurred vision, two-week history of new visual loss/deficit in right  eye - seen by Dr. Alexis Goodell.  MRI and MRA of the brain planned.  Placed on aspirin. Serial neuro checks, telemetry monitoring  3. Hypertension - Continue home antihypertensive medications  4. Acid reflux  - Continue Protonix  5. DVT Prophylaxis: subcutaneous Heparin  6. Nutrition: n.p.o. after midnight otherwise cardiac diet   Code Status:  Full code   Family Communication: Daughter at bedside and discussed neurology and cardiology evaluations for further workup  Disposition: Telemetry unit - may need a multi-day hospitalization   LOS: 1 day   Henrine Screws, MD 01/06/2014, 7:58 AM

## 2014-01-06 NOTE — Consult Note (Signed)
CARDIOLOGY CONSULT NOTE   Patient ID: Kristen Wagner MRN: [id number], DOB/AGE: 1926-11-18   Admit date: 01/05/2014 Date of Consult: 01/06/2014   Primary Physician: Pearla Dubonnet, MD Primary Cardiologist: Donato Schultz  Pt. Profile  Chest pain  Problem List  Past Medical History  Diagnosis Date  . Hypertension   . Arthritis   . Depression   . Diabetes mellitus without complication   . GERD (gastroesophageal reflux disease)   . Osteoarthritis   . Insomnia   . Personal history of calcium pyrophosphate deposition disease (CPPD)     wrists and knees  . Osteoarthritis   . Diverticulosis   . Diverticulitis   . Actinic keratoses   . Fibromyalgia   . Lung nodule 2013    5 cm    Past Surgical History  Procedure Laterality Date  . Thyroid surgery    . Shoulder surgery    . Back surgery    . Abdominal hysterectomy    . Cesarean section    . Knee surgery    . Tonsillectomy    . Cholecystectomy    . Eye surgery    . Appendectomy    . Foot neuroma surgery      Allergies  Allergies  Allergen Reactions  . Codeine Nausea And Vomiting  . Hctz [Hydrochlorothiazide]     hives  . Iodine     hives  . Iohexol Itching  . Prednisone Nausea And Vomiting    And headache   HPI   80 y.o. female with Past medical history of hypertension, arthritis, depression, diabetes mellitus, GERD.  The patient presented with complaints of chest pain. The pain started yesterday started yesterday when the patient was at rest sitting and was associated with blurry vision and some shortness of breath. The patient states that she has on and off intermittent episodes of blurry vision with no syncope in the past. She denies palpitations. This was her first episode of chest pain. He developed retrosternal pressure-like and was a 5/10 with no radiation. The patient is fairly independent, lives alone and is able to cook do laundry for herself. However any moderate exertion is limited by her  arthritis. She has never smoked.   Inpatient Medications  . amLODipine  5 mg Oral Daily  . aspirin EC  325 mg Oral Daily  . enoxaparin (LOVENOX) injection  40 mg Subcutaneous Q24H  . insulin aspart  0-5 Units Subcutaneous QHS  . insulin aspart  0-9 Units Subcutaneous TID WC  . pantoprazole  40 mg Oral BID AC  . simvastatin  20 mg Oral q1800   Family History Family History  Problem Relation Age of Onset  . Stroke Mother   . Heart disease Mother   . AAA (abdominal aortic aneurysm) Father      Social History History   Social History  . Marital Status: Widowed    Spouse Name: N/A    Number of Children: N/A  . Years of Education: N/A   Occupational History  . Not on file.   Social History Main Topics  . Smoking status: Never Smoker   . Smokeless tobacco: Not on file  . Alcohol Use: Yes     Comment: rarely  . Drug Use: No  . Sexual Activity: Not on file   Other Topics Concern  . Not on file   Social History Narrative  . No narrative on file     Review of Systems  General:  No chills, fever, night sweats or  weight changes.  Cardiovascular:  No chest pain, dyspnea on exertion, edema, orthopnea, palpitations, paroxysmal nocturnal dyspnea. Dermatological: No rash, lesions/masses Respiratory: No cough, dyspnea Urologic: No hematuria, dysuria Abdominal:   No nausea, vomiting, diarrhea, bright red blood per rectum, melena, or hematemesis Neurologic:  No visual changes, wkns, changes in mental status. All other systems reviewed and are otherwise negative except as noted above.  Physical Exam  Blood pressure 138/77, pulse 86, temperature 97.4 F (36.3 C), temperature source Oral, resp. rate 18, height 5\' 5"  (1.651 m), weight 171 lb 12.8 oz (77.928 kg), SpO2 96.00%.  General: Pleasant, NAD Psych: Normal affect. Neuro: Alert and oriented X 3. Moves all extremities spontaneously. HEENT: Normal  Neck: Supple without bruits or JVD. Lungs:  Resp regular and unlabored,  CTA. Heart: RRR no s3, s4, or murmurs. Abdomen: Soft, non-tender, non-distended, BS + x 4.  Extremities: No clubbing, cyanosis or edema. DP/PT/Radials 2+ and equal bilaterally.  Labs   Recent Labs  01/05/14 2047 01/06/14 0445  TROPONINI <0.30 <0.30   Lab Results  Component Value Date   WBC 5.6 01/05/2014   HGB 15.4* 01/05/2014   HCT 44.5 01/05/2014   MCV 93.1 01/05/2014   PLT 187 01/05/2014    Recent Labs Lab 01/05/14 1407  NA 138  K 4.2  CL 101  CO2 24  BUN 16  CREATININE 0.59  CALCIUM 9.8  GLUCOSE 182*   No results found for this basename: CHOL, HDL, LDLCALC, TRIG   Lab Results  Component Value Date   DDIMER 0.75* 07/29/2012   Radiology/Studies  Dg Chest 2 View    IMPRESSION: Stable cardiomegaly.  No acute cardiopulmonary abnormality.   Electronically Signed   By: Kristen Wagner  McClintock M.D.   On: 01/05/2014 14:36   [name] Contrast  01/05/2014   IMPRESSION: No acute intracranial process.  Normal noncontrast MRI of the brain for age: Involutional changes. Mild to moderate white matter changes suggest chronic small vessel ischemic disease.   Electronically Signed   By: Kristen Metroourtnay  Wagner   On: 01/05/2014 22:37   Echocardiogram - 07/31/2012 - Left ventricle: The cavity size was normal. Systolic function was normal. The estimated ejection fraction was in the range of 60% to 65%. Although no diagnostic regional wall motion abnormality was identified, this possibility cannot be completely excluded on the basis of this study. - Mitral valve: Calcified annulus. Mild regurgitation. - Pulmonary arteries: Systolic pressure was mildly increased. PA peak pressure: 34mm Hg (S).   ECG: Sinus rhythm, early rotavirus lesion in the inferolateral leads otherwise normal EKG.  Telemetry: Sinus rhythm, PVCs    ASSESSMENT AND PLAN  Pleasant 80 year old female with prior medical history of diabetes, hypertension, hyperlipidemia who presents with   1. Atypical chest pain  associated with shortness of breath and blurry vision. Her EKG looks normal, troponins are negative. We'll proceed with atelectasis can nuclear stress test to rule out ischemia. We will also order carotid Doppler ultrasound to rule out significant stenosis considering her frequent blurry vision episodes. She is also being evaluated by neurology  2. Hypertension - controlled on current regimen  3. Hyperlipidemia - on simvastatin 20 mg daily, followed by her primary care physician.     Signed, Lars MassonNELSON, Nini Cavan H, MD, [facility] CenterFACC 01/06/2014, 9:59 AM

## 2014-01-06 NOTE — Progress Notes (Signed)
Pt provided with dc instructions and education. Pt verbalized understanding. Pt has no questions at this time. IV removed with tip intact. Heart monitor cleaned and returned to front. Pt dc to home with daughter. Levonne Spillerhasidy Danique Hartsough, RN

## 2014-01-06 NOTE — Progress Notes (Signed)
*  PRELIMINARY RESULTS* Vascular Ultrasound Carotid Duplex (Doppler) has been completed.  Preliminary findings: Bilateral:  1-39% ICA stenosis.  Vertebral artery flow is antegrade.      Farrel DemarkJill Eunice, RDMS, RVT  01/06/2014, 1:38 PM

## 2014-01-06 NOTE — Discharge Summary (Signed)
Physician Discharge Summary  NAME:Kristen Wagner  ZOX:[id number]  DOB: 02/21/1927   Admit date: 01/05/2014 Discharge date: 01/06/2014  Discharge Diagnoses:  Principal Problem:   Chest pain - ischemic heart disease ruled out Active Problems:   DEPRESSION   HYPERTENSION   ACID REFLUX DISEASE   Frontal headache   Blurred vision - has improved. Negative MRI for stroke. Carotids clear   Left sided numbness - symptoms resolved. Possibly atypical migraine   Discharge Physical Exam:  General Appearance: Alert, cooperative, no distress, appears stated age  Weight change:  No intake or output data in the 24 hours ending 01/06/14 1804 Filed Vitals:   01/06/14 1230 01/06/14 1232 01/06/14 1236 01/06/14 1600  BP: 143/85 135/68 131/59 112/65  Pulse: 96 96 100 75  Temp:    97.7 F (36.5 C)  TempSrc:    Oral  Resp:    18  Height:      Weight:      SpO2:    96%    Lungs: Clear to auscultation bilaterally, respirations unlabored Heart: Regular rate and rhythm, S1 and S2 normal, no murmur, rub or gallop Abdomen: Soft, non-tender, bowel sounds active all four quadrants, no masses, no organomegaly Extremities: Extremities normal, atraumatic, no cyanosis or edema Neuro: alert and oriented, exam nonfocal  Discharge Condition: improved  Hospital Course: Mrs. Kristen Wagner is a pleasant 80 year old female who I followed for several years. She has a history of hypertension, DJD, depression, type 2 diabetes mellitus, and GERD. She presented to the emergency room yesterday after complaining of visual changes in her right eye with blurring. 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 She was also having some frontal headache and this was followed by substernal chest pain which was heavy and associated with shortness of breath. She called our office and referred her to the yard she was admitted. Subsequently she suggest that her visual changes or right I'm a bit over a longer period of time. She was  chest pain-free in the emergency room. She was seen in consultation both by neurology and cardiology. MRI of the brain was negative for stroke and carotid Dopplers revealed no carotid stenoses. 2-D echocardiogram revealed no mural thrombi and no wall motion abnormalities and an EF of 60-65%. Serial troponins were negative. Myoview stress testing was negative for ischemia. Patient not found to have any unstable neurological or cardiac clinical issues and decision has been made to discharge home on aspirin therapy daily. She will continue her other usual medications. Will followup in the office in one week  Things to follow up in the outpatient setting: Need to followup for any neurological or cardiac symptoms such as focal weakness numbness or tingling or dysarthria or persistent visual change. Also monitor for chest pain shortness of breath palpitations etc.  Consults: Treatment Team:  [facility], MD  [facility], Dr. Thana Farr  Disposition: 01-Home or Self Care  Discharge Orders   Future Orders Complete By Expires   Call MD for:  difficulty breathing, headache or visual disturbances  As directed    Call MD for:  extreme fatigue  As directed    Call MD for:  persistant nausea and vomiting  As directed    Call MD for:  severe uncontrolled pain  As directed    Call MD for:  temperature >100.4  As directed    Diet - low sodium heart healthy  As directed    Increase activity slowly  As directed  Medication List    STOP taking these medications       KETOCONAZOLE PO      TAKE these medications       acetaminophen 500 MG tablet  Commonly known as:  TYLENOL  Take 500 mg by mouth every 6 (six) hours as needed. Arthritis pain     amLODipine 5 MG tablet  Commonly known as:  NORVASC  Take 5 mg by mouth daily.     aspirin 325 MG EC tablet  Take 1 tablet (325 mg total) by mouth daily.     calcium carbonate 600 MG Tabs tablet  Commonly known as:   OS-CAL  Take 600 mg by mouth 2 (two) times daily with a meal.     cetirizine 10 MG tablet  Commonly known as:  ZYRTEC  Take 10 mg by mouth daily.     GABAPENTIN PO  Take by mouth 2 (two) times daily.     ibuprofen 600 MG tablet  Commonly known as:  ADVIL,MOTRIN  Take 600 mg by mouth every 6 (six) hours as needed.     losartan 100 MG tablet  Commonly known as:  COZAAR  Take 100 mg by mouth daily.     metFORMIN 500 MG tablet  Commonly known as:  GLUCOPHAGE  Take by mouth 2 (two) times daily with a meal.     metroNIDAZOLE 0.75 % gel  Commonly known as:  METROGEL  Apply 1 application topically 2 (two) times daily.     ondansetron 4 MG tablet  Commonly known as:  ZOFRAN  Take 4 mg by mouth every 8 (eight) hours as needed for nausea or vomiting.     pantoprazole 40 MG tablet  Commonly known as:  PROTONIX  Take 40 mg by mouth daily.     sertraline 100 MG tablet  Commonly known as:  ZOLOFT  Take 1 tablet (100 mg total) by mouth daily.     simvastatin 20 MG tablet  Commonly known as:  ZOCOR  Take 20 mg by mouth daily.     Vitamin D3 2000 UNITS Tabs  Take by mouth.     zolpidem 10 MG tablet  Commonly known as:  AMBIEN  Take 10 mg by mouth at bedtime as needed for sleep.         The results of significant diagnostics from this hospitalization (including imaging, microbiology, ancillary and laboratory) are listed below for reference.    Significant Diagnostic Studies: Dg Chest 2 View  01/05/2014   CLINICAL DATA:  Chest pain, shortness of breath, hypertension  EXAM: CHEST  2 VIEW  COMPARISON:  Prior radiograph from 07/29/2012  FINDINGS: Mild cardiomegaly is stable as compared to prior exam. Surgical clips overlie the lower left neck, unchanged.  The lungs are normally inflated. No airspace consolidation, pleural effusion, or pulmonary edema is identified. There is no pneumothorax.  No acute osseous abnormality identified. Degenerative changes within the visualized spine  again noted, unchanged.  IMPRESSION: Stable cardiomegaly.  No acute cardiopulmonary abnormality.   Electronically Signed   By: Rise Mu M.D.   On: 01/05/2014 14:36   Ct Head Wo Contrast  01/05/2014   CLINICAL DATA:  Headache.  Blurred vision.  EXAM: CT HEAD WITHOUT CONTRAST  TECHNIQUE: Contiguous axial images were obtained from the base of the skull through the vertex without intravenous contrast.  COMPARISON:  03/23/2010.  FINDINGS: No intracranial hemorrhage.  Small vessel disease type changes without CT evidence of large acute infarct.  No intracranial  mass lesion noted on this unenhanced exam.  No hydrocephalus.  Vascular calcifications.  Transverse ligament hypertrophy with associated calcifications contributes to spinal stenosis and mild cord flattening.  Prior cataract surgery otherwise orbital structures unremarkable.  IMPRESSION: No intracranial hemorrhage.  Small vessel disease type changes without CT evidence of large acute infarct.  Transverse ligament hypertrophy with associated calcifications contributes to spinal stenosis and mild cord flattening.   Electronically Signed   By: Bridgett LarssonSteve  Olson M.D.   On: 01/05/2014 17:24   [name] Contrast  01/05/2014   CLINICAL DATA:  Headache and blurred vision intermittently for 1-2 weeks.  EXAM: MRI HEAD WITHOUT CONTRAST  TECHNIQUE: Multiplanar, multiecho pulse sequences of the brain and surrounding structures were obtained without intravenous contrast.  COMPARISON:  CT HEAD W/O CM dated 01/05/2014  FINDINGS: No reduced diffusion to suggest acute ischemia. No susceptibility artifact to suggest hemorrhage.  The ventricles and sulci are normal for patient's age. Patchy supratentorial white matter and to lesser extent pontine T2 hyperintensities are less than expected for age. No midline shift or mass effect.  No abnormal extra-axial fluid collections. Normal major intracranial vascular flow voids seen at the skull base.  Status post bilateral ocular  lens implants. Ocular globes and orbital contents are nonsuspicious though not tailored for evaluation. Paranasal sinuses and mastoid air cells are well aerated. No abnormal sellar expansion. Degenerative change of the odontoid process with surrounding pannus likely reflects CPPD, mildly narrowing the cerebral spinal fluid space. No cerebellar tonsillar ectopia. Moderate temporomandibular osteoarthrosis.  IMPRESSION: No acute intracranial process.  Normal noncontrast MRI of the brain for age: Involutional changes. Mild to moderate white matter changes suggest chronic small vessel ischemic disease.   Electronically Signed   By: Awilda Metroourtnay  Bloomer   On: 01/05/2014 22:37   Nm Myocar Multi W/spect W/wall Motion / Ef  01/06/2014   CLINICAL DATA:  Chest pain  EXAM: Lexiscan Myovue  TECHNIQUE: The patient received IV Lexiscan .4mg  over 15 seconds. 33.0 mCi of Technetium 7468m Sestamibi injected at 30 seconds. Quantitative SPECT images were obtained in the vertical, horizontal and short axis planes after a 45 minute delay. Rest images were obtained with similar planes and delay using 10.2 mCi of Technetium 7568m Sestamibi.  FINDINGS: ECG: Baseline electrocardiogram showed sinus rhythm, first-degree AV block, cannot rule out prior inferior infarct. With lexiscan infusion there were no diagnostic ST changes.  Symptoms:  There was dyspnea but no chest pain.  RAW Data: There was uptake in the left upper extremity IV. Otherwise acquisition good.  Quantitiative Gated SPECT EF: The gated ejection fraction was 76%. Wall motion normal. End-diastolic volume 40 cc. End systolic volume 9 cc. TID 0.90.  Perfusion Images: Perfusion was normal with no ischemia or infarction.  IMPRESSION: Normal stress nuclear study with no chest pain, no ST changes and no evidence of ischemia or infarction. The gated ejection fraction was 76% and the wall motion was normal.  Brain Crenshaw   Electronically Signed   By: Ivor MessierBrain  Crenshaw   On: 01/06/2014 17:02     Microbiology: No results found for this or any previous visit (from the past 240 hour(s)).   Labs: Results for orders placed during the hospital encounter of 01/05/14  CBC      Result Value Ref Range   WBC 5.6  4.0 - 10.5 K/uL   RBC 4.78  3.87 - 5.11 MIL/uL   Hemoglobin 15.4 (*) 12.0 - 15.0 g/dL   HCT 16.144.5  09.636.0 - 04.546.0 %  MCV 93.1  78.0 - 100.0 fL   MCH 32.2  26.0 - 34.0 pg   MCHC 34.6  30.0 - 36.0 g/dL   RDW 44.0  10.2 - 72.5 %   Platelets 187  150 - 400 K/uL  BASIC METABOLIC PANEL      Result Value Ref Range   Sodium 138  137 - 147 mEq/L   Potassium 4.2  3.7 - 5.3 mEq/L   Chloride 101  96 - 112 mEq/L   CO2 24  19 - 32 mEq/L   Glucose, Bld 182 (*) 70 - 99 mg/dL   BUN 16  6 - 23 mg/dL   Creatinine, Ser 3.66  0.50 - 1.10 mg/dL   Calcium 9.8  8.4 - 44.0 mg/dL   GFR calc non Af Amer 82 (*) >90 mL/min   GFR calc Af Amer >90  >90 mL/min  PRO B NATRIURETIC PEPTIDE      Result Value Ref Range   Pro B Natriuretic peptide (BNP) 75.5  0 - 450 pg/mL  URINALYSIS, ROUTINE W REFLEX MICROSCOPIC      Result Value Ref Range   Color, Urine YELLOW  YELLOW   APPearance CLEAR  CLEAR   Specific Gravity, Urine 1.018  1.005 - 1.030   pH 5.5  5.0 - 8.0   Glucose, UA NEGATIVE  NEGATIVE mg/dL   Hgb urine dipstick NEGATIVE  NEGATIVE   Bilirubin Urine NEGATIVE  NEGATIVE   Ketones, ur NEGATIVE  NEGATIVE mg/dL   Protein, ur NEGATIVE  NEGATIVE mg/dL   Urobilinogen, UA 0.2  0.0 - 1.0 mg/dL   Nitrite NEGATIVE  NEGATIVE   Leukocytes, UA NEGATIVE  NEGATIVE  SEDIMENTATION RATE      Result Value Ref Range   Sed Rate 13  0 - 22 mm/hr  TROPONIN I      Result Value Ref Range   Troponin I <0.30  <0.30 ng/mL  TROPONIN I      Result Value Ref Range   Troponin I <0.30  <0.30 ng/mL  GLUCOSE, CAPILLARY      Result Value Ref Range   Glucose-Capillary 144 (*) 70 - 99 mg/dL   Comment 1 Notify RN    C-REACTIVE PROTEIN      Result Value Ref Range   CRP <0.5 (*) <0.60 mg/dL  GLUCOSE, CAPILLARY       Result Value Ref Range   Glucose-Capillary 155 (*) 70 - 99 mg/dL   Comment 1 Documented in Chart     Comment 2 Notify RN    GLUCOSE, CAPILLARY      Result Value Ref Range   Glucose-Capillary 132 (*) 70 - 99 mg/dL   Comment 1 Documented in Chart     Comment 2 Notify RN    I-STAT TROPOININ, ED      Result Value Ref Range   Troponin i, poc 0.00  0.00 - 0.08 ng/mL   Comment 3             Time coordinating discharge: 33 minutes  Signed: Pearla Dubonnet, MD 01/06/2014, 6:04 PM

## 2014-01-06 NOTE — Progress Notes (Signed)
NEURO HOSPITALIST PROGRESS NOTE   SUBJECTIVE:                                                                                                                        Patient is feeling better and her right eye vision.  She states she does have a opthalmology appointment next week.    OBJECTIVE:                                                                                                                           Vital signs in last 24 hours: Temp:  [97.4 F (36.3 C)-98.6 F (37 C)] 97.4 F (36.3 C) (04/01 0800) Pulse Rate:  [76-90] 86 (04/01 0800) Resp:  [10-18] 18 (04/01 0800) BP: (134-174)/(72-91) 138/77 mmHg (04/01 0800) SpO2:  [95 %-97 %] 96 % (04/01 0800) Weight:  [77.928 kg (171 lb 12.8 oz)] 77.928 kg (171 lb 12.8 oz) (04/01 0400)  Intake/Output from previous day:   Intake/Output this shift:   Nutritional status: NPO  Past Medical History  Diagnosis Date  . Hypertension   . Arthritis   . Depression   . Diabetes mellitus without complication   . GERD (gastroesophageal reflux disease)   . Osteoarthritis   . Insomnia   . Personal history of calcium pyrophosphate deposition disease (CPPD)     wrists and knees  . Osteoarthritis   . Diverticulosis   . Diverticulitis   . Actinic keratoses   . Fibromyalgia   . Lung nodule 2013    5 cm     Neurologic Exam:   Mental Status: Alert, oriented, thought content appropriate.  Speech fluent without evidence of aphasia.  Able to follow 3 step commands without difficulty. Cranial Nerves: II: Unable to see out of the left eye, right pupils round, reactive to light and accommodation. Left pupil irregualr but reactive III,IV, VI: ptosis not present, extra-ocular motions intact bilaterally V,VII: smile symmetric, facial light touch sensation normal bilaterally VIII: hearing normal bilaterally IX,X: gag reflex present XI: bilateral shoulder shrug XII: midline tongue extension without  atrophy or fasciculations  Motor: Right : Upper extremity   5/5    Left:     Upper extremity   5/5  Lower extremity   5/5  Lower extremity   5/5 Tone and bulk:normal tone throughout; no atrophy noted Sensory: Pinprick and light touch intact throughout, bilaterally Deep Tendon Reflexes:  2+ in the upper extremities, 1+ at the right knee, absent at the left knee and ankles bilaterally  Plantars: Right: mute   Left: mute Cerebellar: normal finger-to-nose,  normal heel-to-shin test CV: pulses palpable throughout   Lab Results: Basic Metabolic Panel:  Recent Labs Lab 01/05/14 1407  NA 138  K 4.2  CL 101  CO2 24  GLUCOSE 182*  BUN 16  CREATININE 0.59  CALCIUM 9.8    Liver Function Tests: No results found for this basename: AST, ALT, ALKPHOS, BILITOT, PROT, ALBUMIN,  in the last 168 hours No results found for this basename: LIPASE, AMYLASE,  in the last 168 hours No results found for this basename: AMMONIA,  in the last 168 hours  CBC:  Recent Labs Lab 01/05/14 1407  WBC 5.6  HGB 15.4*  HCT 44.5  MCV 93.1  PLT 187    Cardiac Enzymes:  Recent Labs Lab 01/05/14 2047 01/06/14 0445  TROPONINI <0.30 <0.30    Lipid Panel: No results found for this basename: CHOL, TRIG, HDL, CHOLHDL, VLDL, LDLCALC,  in the last 168 hours  CBG:  Recent Labs Lab 01/05/14 2304 01/06/14 0733  GLUCAP 144* 155*    Microbiology: No results found for this or any previous visit.  Coagulation Studies: No results found for this basename: LABPROT, INR,  in the last 72 hours  Imaging: Dg Chest 2 View  01/05/2014   CLINICAL DATA:  Chest pain, shortness of breath, hypertension  EXAM: CHEST  2 VIEW  COMPARISON:  Prior radiograph from 07/29/2012  FINDINGS: Mild cardiomegaly is stable as compared to prior exam. Surgical clips overlie the lower left neck, unchanged.  The lungs are normally inflated. No airspace consolidation, pleural effusion, or pulmonary edema is identified. There is  no pneumothorax.  No acute osseous abnormality identified. Degenerative changes within the visualized spine again noted, unchanged.  IMPRESSION: Stable cardiomegaly.  No acute cardiopulmonary abnormality.   Electronically Signed   By: Jeannine Boga M.D.   On: 01/05/2014 14:36   Ct Head Wo Contrast  01/05/2014   CLINICAL DATA:  Headache.  Blurred vision.  EXAM: CT HEAD WITHOUT CONTRAST  TECHNIQUE: Contiguous axial images were obtained from the base of the skull through the vertex without intravenous contrast.  COMPARISON:  03/23/2010.  FINDINGS: No intracranial hemorrhage.  Small vessel disease type changes without CT evidence of large acute infarct.  No intracranial mass lesion noted on this unenhanced exam.  No hydrocephalus.  Vascular calcifications.  Transverse ligament hypertrophy with associated calcifications contributes to spinal stenosis and mild cord flattening.  Prior cataract surgery otherwise orbital structures unremarkable.  IMPRESSION: No intracranial hemorrhage.  Small vessel disease type changes without CT evidence of large acute infarct.  Transverse ligament hypertrophy with associated calcifications contributes to spinal stenosis and mild cord flattening.   Electronically Signed   By: Chauncey Cruel M.D.   On: 01/05/2014 17:24   [name] Contrast  01/05/2014   CLINICAL DATA:  Headache and blurred vision intermittently for 1-2 weeks.  EXAM: MRI HEAD WITHOUT CONTRAST  TECHNIQUE: Multiplanar, multiecho pulse sequences of the brain and surrounding structures were obtained without intravenous contrast.  COMPARISON:  CT HEAD W/O CM dated 01/05/2014  FINDINGS: No reduced diffusion to suggest acute ischemia. No susceptibility artifact to suggest hemorrhage.  The ventricles and sulci are normal for patient's age. Patchy  supratentorial white matter and to lesser extent pontine T2 hyperintensities are less than expected for age. No midline shift or mass effect.  No abnormal extra-axial fluid  collections. Normal major intracranial vascular flow voids seen at the skull base.  Status post bilateral ocular lens implants. Ocular globes and orbital contents are nonsuspicious though not tailored for evaluation. Paranasal sinuses and mastoid air cells are well aerated. No abnormal sellar expansion. Degenerative change of the odontoid process with surrounding pannus likely reflects CPPD, mildly narrowing the cerebral spinal fluid space. No cerebellar tonsillar ectopia. Moderate temporomandibular osteoarthrosis.  IMPRESSION: No acute intracranial process.  Normal noncontrast MRI of the brain for age: Involutional changes. Mild to moderate white matter changes suggest chronic small vessel ischemic disease.   Electronically Signed   By: Elon Alas   On: 01/05/2014 22:37     MEDICATIONS                                                                                                                        Scheduled: . amLODipine  5 mg Oral Daily  . aspirin EC  325 mg Oral Daily  . enoxaparin (LOVENOX) injection  40 mg Subcutaneous Q24H  . insulin aspart  0-5 Units Subcutaneous QHS  . insulin aspart  0-9 Units Subcutaneous TID WC  . pantoprazole  40 mg Oral BID AC  . simvastatin  20 mg Oral q1800    ASSESSMENT/PLAN:                                                                                                           80 y.o. female presenting with chest pain. Also reports a two week history of loss of vision in the right eye and poor balance. Etiology unclear. MRI head negative. ESR normal.   Recommend: 1) Continue ASA 2) CRP pending--if negative no further work up 3) Agree with out patient opthalmology.      Neuro S/O  Assessment and plan discussed with with attending physician and they are in agreement.    Kristen Quill PA-C [facility] [phone]  01/06/2014, 10:29 AM

## 2014-01-06 NOTE — ED Provider Notes (Signed)
Medical screening examination/treatment/procedure(s) were conducted as a shared visit with non-physician practitioner(s) and myself.  I personally evaluated the patient during the encounter.   EKG Interpretation   Date/Time:  Tuesday January 05 2014 14:01:12 EDT Ventricular Rate:  87 PR Interval:  184 QRS Duration: 88 QT Interval:  374 QTC Calculation: 450 R Axis:   44 Text Interpretation:  Normal sinus rhythm No significant change since last  tracing Confirmed by Jazelle Achey  MD, Dawnell Bryant ([id number]) on 01/05/2014 4:51:08 PM      Patient with episode of concerning chest pressure, dizziness and dyspnea. Also with intermittent frontal headache, as well as right eye vision changes for several days. Mild right temporal tenderness. Will send ESR, check IOP, and admit to medicine for r/o ACS.  Ephraim Hamburger, MD 01/06/14 [phone]

## 2014-01-06 NOTE — Progress Notes (Signed)
  Echocardiogram 2D Echocardiogram has been performed.  Cathie BeamsGREGORY, Rivaan Kendall 01/06/2014, 2:11 PM

## 2014-01-06 NOTE — Consult Note (Signed)
Referring Physician: Posey Pronto    Chief Complaint: Decrease in vision  HPI: Kristen Wagner is an 80 y.o. female that reports that for the past 2 weeks she has noticed decreased vision from her right eye.  She already has poor vision from her left eye and now has decreased vision in the right.  She reports that her vision loss has been gradual and does not recall a shade or cut in vision.  She also reports that for that period of time she has been off balance.  She does not feel this is due to her decreased visual acuity.  She has also had two episodes of vertigo that occurred after going to the bathroom.  These episodes were self-limited.  She reports that she has had these in the past but they are not frequent.   Describes a frontal headache that started today.  No associated nausea, vomiting, photophobia or phonophobia.  Date last known well: Unable to determine Time last known well: Unable to determine tPA Given: No: Unable to determine LKW  Past Medical History  Diagnosis Date  . Hypertension   . Arthritis   . Depression   . Diabetes mellitus without complication   . GERD (gastroesophageal reflux disease)   . Osteoarthritis   . Insomnia   . Personal history of calcium pyrophosphate deposition disease (CPPD)     wrists and knees  . Osteoarthritis   . Diverticulosis   . Diverticulitis   . Actinic keratoses   . Fibromyalgia   . Lung nodule 2013    5 cm    Past Surgical History  Procedure Laterality Date  . Thyroid surgery    . Shoulder surgery    . Back surgery    . Abdominal hysterectomy    . Cesarean section    . Knee surgery    . Tonsillectomy    . Cholecystectomy    . Eye surgery    . Appendectomy    . Foot neuroma surgery      Family History  Problem Relation Age of Onset  . Stroke Mother   . Heart disease Mother   . AAA (abdominal aortic aneurysm) Father    Social History:  reports that she has never smoked. She does not have any smokeless tobacco history on  file. She reports that she drinks alcohol. She reports that she does not use illicit drugs.  Allergies:  Allergies  Allergen Reactions  . Codeine Nausea And Vomiting  . Hctz [Hydrochlorothiazide]     hives  . Iodine     hives  . Iohexol Itching  . Prednisone Nausea And Vomiting    And headache    Medications:  I have reviewed the patient's current medications. Prior to Admission:  Prescriptions prior to admission  Medication Sig Dispense Refill  . acetaminophen (TYLENOL) 500 MG tablet Take 500 mg by mouth every 6 (six) hours as needed. Arthritis pain      . amLODipine (NORVASC) 5 MG tablet Take 5 mg by mouth daily.      . calcium carbonate (OS-CAL) 600 MG TABS tablet Take 600 mg by mouth 2 (two) times daily with a meal.      . cetirizine (ZYRTEC) 10 MG tablet Take 10 mg by mouth daily.      . Cholecalciferol (VITAMIN D3) 2000 UNITS TABS Take by mouth.      Marland Kitchen GABAPENTIN PO Take by mouth 2 (two) times daily.      Marland Kitchen ibuprofen (ADVIL,MOTRIN) 600 MG tablet  Take 600 mg by mouth every 6 (six) hours as needed.      Marland Kitchen KETOCONAZOLE PO Take by mouth. 2% cream      . losartan (COZAAR) 100 MG tablet Take 100 mg by mouth daily.      . metFORMIN (GLUCOPHAGE) 500 MG tablet Take by mouth 2 (two) times daily with a meal.      . metroNIDAZOLE (METROGEL) 0.75 % gel Apply 1 application topically 2 (two) times daily.      . ondansetron (ZOFRAN) 4 MG tablet Take 4 mg by mouth every 8 (eight) hours as needed for nausea or vomiting.      . pantoprazole (PROTONIX) 40 MG tablet Take 40 mg by mouth daily.      . simvastatin (ZOCOR) 20 MG tablet Take 20 mg by mouth daily.      Marland Kitchen zolpidem (AMBIEN) 10 MG tablet Take 10 mg by mouth at bedtime as needed for sleep.       Scheduled: . amLODipine  5 mg Oral Daily  . aspirin EC  325 mg Oral Daily  . enoxaparin (LOVENOX) injection  40 mg Subcutaneous Q24H  . insulin aspart  0-5 Units Subcutaneous QHS  . insulin aspart  0-9 Units Subcutaneous TID WC  .  pantoprazole  40 mg Oral BID AC  . simvastatin  20 mg Oral q1800    ROS: History obtained from the patient  General ROS: negative for - chills, fatigue, fever, night sweats, weight gain or weight loss Psychological ROS: negative for - behavioral disorder, hallucinations, memory difficulties, mood swings or suicidal ideation Ophthalmic ROS: negative for - blurry vision, double vision, eye pain or loss of vision ENT ROS: negative for - epistaxis, nasal discharge, oral lesions, sore throat, tinnitus or vertigo Allergy and Immunology ROS: stuffed sinuses Hematological and Lymphatic ROS: negative for - bleeding problems, bruising or swollen lymph nodes Endocrine ROS: negative for - galactorrhea, hair pattern changes, polydipsia/polyuria or temperature intolerance Respiratory ROS: negative for - cough, hemoptysis, shortness of breath or wheezing Cardiovascular ROS: chest pain Gastrointestinal ROS: indigestion Genito-Urinary ROS: negative for - dysuria, hematuria, incontinence or urinary frequency/urgency Musculoskeletal ROS: negative for - joint swelling or muscular weakness Neurological ROS: as noted in HPI Dermatological ROS: negative for rash and skin lesion changes  Physical Examination: Blood pressure 138/72, pulse 76, temperature 98.5 F (36.9 C), temperature source Oral, resp. rate 18, height _0  (1.651 m), weight 77.928 kg (171 lb 12.8 oz), SpO2 95.00%.  Neurologic Examination: Mental Status: Alert, oriented, thought content appropriate.  Speech fluent without evidence of aphasia.  Able to follow 3 step commands without difficulty. Cranial Nerves: II: Discs flat bilaterally; Unable to see out of the left eye, right pupils round, reactive to light and accommodation.  Left pupil irregualr but reactive III,IV, VI: mild left eye ptosis, extra-ocular motions intact bilaterally V,VII: smile symmetric, facial light touch sensation normal bilaterally VIII: hearing normal  bilaterally IX,X: gag reflex present XI: bilateral shoulder shrug XII: midline tongue extension Motor: Right : Upper extremity   5/5    Left:     Upper extremity   5/5  Lower extremity   5/5     Lower extremity   5/5 Tone and bulk:normal tone throughout; no atrophy noted Sensory: Pinprick and light touch intact throughout, bilaterally Deep Tendon Reflexes: 2+ in the upper extremities, 1+ at the right knee, absent at the left knee and ankles bilaterally Plantars: Right: mute   Left: mute Cerebellar: normal finger-to-nose and normal heel-to-shin  test Gait: On standing with a narrow based stance is unable to maintain balance.   CV: pulses palpable throughout     Laboratory Studies:  Basic Metabolic Panel:  Recent Labs Lab 01/05/14 1407  NA 138  K 4.2  CL 101  CO2 24  GLUCOSE 182*  BUN 16  CREATININE 0.59  CALCIUM 9.8    Liver Function Tests: No results found for this basename: AST, ALT, ALKPHOS, BILITOT, PROT, ALBUMIN,  in the last 168 hours No results found for this basename: LIPASE, AMYLASE,  in the last 168 hours No results found for this basename: AMMONIA,  in the last 168 hours  CBC:  Recent Labs Lab 01/05/14 1407  WBC 5.6  HGB 15.4*  HCT 44.5  MCV 93.1  PLT 187    Cardiac Enzymes:  Recent Labs Lab 01/05/14 2047  TROPONINI <0.30    BNP: No components found with this basename: POCBNP,   CBG:  Recent Labs Lab 01/05/14 2304  GLUCAP 144*    Microbiology: No results found for this or any previous visit.  Coagulation Studies: No results found for this basename: LABPROT, INR,  in the last 72 hours  Urinalysis:  Recent Labs Lab 01/05/14 1824  COLORURINE YELLOW  LABSPEC 1.018  PHURINE 5.5  [place]  UROBILINOGEN 0.2  NITRITE NEGATIVE  LEUKOCYTESUR NEGATIVE    Lipid Panel: No results found for this basename: chol, trig, hdl, cholhdl, vldl,  ldlcalc    HgbA1C:  No results found for this basename: HGBA1C    Urine Drug Screen:   No results found for this basename: labopia, cocainscrnur, labbenz, amphetmu, thcu, labbarb    Alcohol Level: No results found for this basename: ETH,  in the last 168 hours  Other results: EKG: normal sinus rhythm at 87 bpm.  Imaging: Dg Chest 2 View  01/05/2014   CLINICAL DATA:  Chest pain, shortness of breath, hypertension  EXAM: CHEST  2 VIEW  COMPARISON:  Prior radiograph from 07/29/2012  FINDINGS: Mild cardiomegaly is stable as compared to prior exam. Surgical clips overlie the lower left neck, unchanged.  The lungs are normally inflated. No airspace consolidation, pleural effusion, or pulmonary edema is identified. There is no pneumothorax.  No acute osseous abnormality identified. Degenerative changes within the visualized spine again noted, unchanged.  IMPRESSION: Stable cardiomegaly.  No acute cardiopulmonary abnormality.   Electronically Signed   By: Jeannine Boga M.D.   On: 01/05/2014 14:36   Ct Head Wo Contrast  01/05/2014   CLINICAL DATA:  Headache.  Blurred vision.  EXAM: CT HEAD WITHOUT CONTRAST  TECHNIQUE: Contiguous axial images were obtained from the base of the skull through the vertex without intravenous contrast.  COMPARISON:  03/23/2010.  FINDINGS: No intracranial hemorrhage.  Small vessel disease type changes without CT evidence of large acute infarct.  No intracranial mass lesion noted on this unenhanced exam.  No hydrocephalus.  Vascular calcifications.  Transverse ligament hypertrophy with associated calcifications contributes to spinal stenosis and mild cord flattening.  Prior cataract surgery otherwise orbital structures unremarkable.  IMPRESSION: No intracranial hemorrhage.  Small vessel disease type changes without CT evidence of large acute infarct.  Transverse ligament hypertrophy with associated calcifications contributes to spinal stenosis and mild cord flattening.    Electronically Signed   By: Chauncey Cruel M.D.   On: 01/05/2014 17:24   [name] Contrast  01/05/2014   CLINICAL DATA:  Headache and blurred  vision intermittently for 1-2 weeks.  EXAM: MRI HEAD WITHOUT CONTRAST  TECHNIQUE: Multiplanar, multiecho pulse sequences of the brain and surrounding structures were obtained without intravenous contrast.  COMPARISON:  CT HEAD W/O CM dated 01/05/2014  FINDINGS: No reduced diffusion to suggest acute ischemia. No susceptibility artifact to suggest hemorrhage.  The ventricles and sulci are normal for patient's age. Patchy supratentorial white matter and to lesser extent pontine T2 hyperintensities are less than expected for age. No midline shift or mass effect.  No abnormal extra-axial fluid collections. Normal major intracranial vascular flow voids seen at the skull base.  Status post bilateral ocular lens implants. Ocular globes and orbital contents are nonsuspicious though not tailored for evaluation. Paranasal sinuses and mastoid air cells are well aerated. No abnormal sellar expansion. Degenerative change of the odontoid process with surrounding pannus likely reflects CPPD, mildly narrowing the cerebral spinal fluid space. No cerebellar tonsillar ectopia. Moderate temporomandibular osteoarthrosis.  IMPRESSION: No acute intracranial process.  Normal noncontrast MRI of the brain for age: Involutional changes. Mild to moderate white matter changes suggest chronic small vessel ischemic disease.   Electronically Signed   By: Elon Alas   On: 01/05/2014 22:37    Assessment: 80 y.o. female presenting with chest pain.  Also reports a two week history of loss of vision in the right eye and poor balance.  Etiology unclear.  Although acute infarct is on the differential, would also consider TA, etc.  Further work up recommended.    Stroke Risk Factors - diabetes mellitus and hypertension  Plan: 1. MRI, MRA  of the brain without contrast.  Would only pursue a stroke  work up if imaging is diagnostic of an acute infarct.   2. ESR, CRP 3. Prophylactic therapy-Antiplatelet med: Aspirin - dose 378m daily 4. Telemetry monitoring 5. Frequent neuro checks    LAlexis Goodell MD [facility] [phone]/10/2013, 1:30 AM

## 2014-01-06 NOTE — Progress Notes (Signed)
UR completed 

## 2014-01-08 ENCOUNTER — Telehealth: Payer: Self-pay

## 2014-01-08 NOTE — Telephone Encounter (Signed)
**Note De-Identified [name] Obfuscation** Message copied by Aloysius Heinle, Lorelle FormosaPATRICIA M on Fri Jan 08, 2014  9:57 AM ------      Message from: Lars MassonNELSON, KATARINA H      Created: Thu Jan 07, 2014  4:40 PM       Normal sono carotids, would you let her know?      Thank you,      Aris LotKatarina ------

## 2014-01-08 NOTE — Telephone Encounter (Signed)
**Note De-identified Kristen Wagner Obfuscation** The pt is advised, she verbalized understanding. 

## 2014-01-19 ENCOUNTER — Other Ambulatory Visit: Payer: Self-pay

## 2014-01-19 DIAGNOSIS — Z1231 Encounter for screening mammogram for malignant neoplasm of breast: Secondary | ICD-10-CM

## 2014-02-19 ENCOUNTER — Encounter (INDEPENDENT_AMBULATORY_CARE_PROVIDER_SITE_OTHER): Payer: Self-pay

## 2014-02-19 ENCOUNTER — Ambulatory Visit
Admission: RE | Admit: 2014-02-19 | Discharge: 2014-02-19 | Disposition: A | Discharge status: Home / Self Care | Payer: Medicare Other | Source: Ambulatory Visit

## 2014-02-19 DIAGNOSIS — Z1231 Encounter for screening mammogram for malignant neoplasm of breast: Secondary | ICD-10-CM

## 2014-07-01 ENCOUNTER — Encounter: Payer: Self-pay | Admitting: Gastroenterology

## 2015-02-07 ENCOUNTER — Other Ambulatory Visit: Payer: Self-pay

## 2015-02-07 DIAGNOSIS — Z1231 Encounter for screening mammogram for malignant neoplasm of breast: Secondary | ICD-10-CM

## 2015-02-25 ENCOUNTER — Ambulatory Visit: Payer: Self-pay

## 2015-07-28 ENCOUNTER — Encounter (HOSPITAL_COMMUNITY): Payer: Self-pay | Admitting: *Deleted

## 2015-07-28 ENCOUNTER — Emergency Department (HOSPITAL_COMMUNITY)
Admission: EM | Admit: 2015-07-28 | Discharge: 2015-07-29 | Disposition: A | Discharge status: Home / Self Care | Payer: Medicare Other | Attending: Emergency Medicine | Admitting: Emergency Medicine

## 2015-07-28 DIAGNOSIS — R509 Fever, unspecified: Secondary | ICD-10-CM

## 2015-07-28 DIAGNOSIS — Z792 Long term (current) use of antibiotics: Secondary | ICD-10-CM | POA: Diagnosis not present

## 2015-07-28 DIAGNOSIS — Z7982 Long term (current) use of aspirin: Secondary | ICD-10-CM | POA: Diagnosis not present

## 2015-07-28 DIAGNOSIS — R110 Nausea: Secondary | ICD-10-CM | POA: Diagnosis not present

## 2015-07-28 DIAGNOSIS — M1990 Unspecified osteoarthritis, unspecified site: Secondary | ICD-10-CM | POA: Insufficient documentation

## 2015-07-28 DIAGNOSIS — J069 Acute upper respiratory infection, unspecified: Secondary | ICD-10-CM | POA: Diagnosis not present

## 2015-07-28 DIAGNOSIS — I10 Essential (primary) hypertension: Secondary | ICD-10-CM | POA: Diagnosis not present

## 2015-07-28 DIAGNOSIS — G4700 Insomnia, unspecified: Secondary | ICD-10-CM | POA: Diagnosis not present

## 2015-07-28 DIAGNOSIS — R300 Dysuria: Secondary | ICD-10-CM | POA: Diagnosis not present

## 2015-07-28 DIAGNOSIS — Z79899 Other long term (current) drug therapy: Secondary | ICD-10-CM | POA: Insufficient documentation

## 2015-07-28 DIAGNOSIS — F329 Major depressive disorder, single episode, unspecified: Secondary | ICD-10-CM | POA: Insufficient documentation

## 2015-07-28 DIAGNOSIS — K219 Gastro-esophageal reflux disease without esophagitis: Secondary | ICD-10-CM | POA: Diagnosis not present

## 2015-07-28 DIAGNOSIS — Z872 Personal history of diseases of the skin and subcutaneous tissue: Secondary | ICD-10-CM | POA: Insufficient documentation

## 2015-07-28 DIAGNOSIS — Z87448 Personal history of other diseases of urinary system: Secondary | ICD-10-CM | POA: Diagnosis not present

## 2015-07-28 DIAGNOSIS — E119 Type 2 diabetes mellitus without complications: Secondary | ICD-10-CM | POA: Diagnosis not present

## 2015-07-28 HISTORY — DX: Disorder of kidney and ureter, unspecified: N28.9

## 2015-07-28 LAB — BASIC METABOLIC PANEL
ANION GAP: 5 (ref 5–15)
BUN: 12 mg/dL (ref 6–20)
CO2: 27 mmol/L (ref 22–32)
Calcium: 9.2 mg/dL (ref 8.9–10.3)
Chloride: 102 mmol/L (ref 101–111)
Creatinine, Ser: 0.7 mg/dL (ref 0.44–1.00)
Glucose, Bld: 173 mg/dL — ABNORMAL HIGH (ref 65–99)
POTASSIUM: 3.9 mmol/L (ref 3.5–5.1)
SODIUM: 134 mmol/L — AB (ref 135–145)

## 2015-07-28 LAB — CBC
HEMATOCRIT: 42 % (ref 36.0–46.0)
Hemoglobin: 14 g/dL (ref 12.0–15.0)
MCH: 31.8 pg (ref 26.0–34.0)
MCHC: 33.3 g/dL (ref 30.0–36.0)
MCV: 95.5 fL (ref 78.0–100.0)
Platelets: 164 10*3/uL (ref 150–400)
RBC: 4.4 MIL/uL (ref 3.87–5.11)
RDW: 13.3 % (ref 11.5–15.5)
WBC: 9.5 10*3/uL (ref 4.0–10.5)

## 2015-07-28 MED ORDER — ONDANSETRON 4 MG PO TBDP
4.0000 mg | ORAL_TABLET | Freq: Once | ORAL | Status: AC | PRN
  Administered 2015-07-28: 4 mg via ORAL
  Filled 2015-07-28: qty 1

## 2015-07-28 MED ORDER — ACETAMINOPHEN 325 MG PO TABS
650.0000 mg | ORAL_TABLET | Freq: Once | ORAL | Status: AC | PRN
  Administered 2015-07-28: 650 mg via ORAL
  Filled 2015-07-28: qty 2

## 2015-07-28 NOTE — ED Notes (Signed)
Bed: WA21 Expected date:  Expected time:  Means of arrival:  Comments: Triage 2 

## 2015-07-28 NOTE — ED Notes (Signed)
Pt states that she began not feeling well yesterday; pt states that she began to have a fever last night; pt states that she has felt bad all day and has been in bed most of the day; pt states that she had frequent urination yesterday but not today; pt with no appetite today; poor po intake; pt states "I just feel rotten"; pt with generalized weakness; nausea that began this afternoon

## 2015-07-29 ENCOUNTER — Emergency Department (HOSPITAL_COMMUNITY): Payer: Medicare Other

## 2015-07-29 LAB — URINALYSIS, ROUTINE W REFLEX MICROSCOPIC
GLUCOSE, UA: NEGATIVE mg/dL
Hgb urine dipstick: NEGATIVE
Ketones, ur: NEGATIVE mg/dL
Nitrite: NEGATIVE
PROTEIN: 30 mg/dL — AB
Specific Gravity, Urine: 1.028 (ref 1.005–1.030)
Urobilinogen, UA: 1 mg/dL (ref 0.0–1.0)
pH: 5.5 (ref 5.0–8.0)

## 2015-07-29 LAB — URINE MICROSCOPIC-ADD ON

## 2015-07-29 NOTE — ED Provider Notes (Signed)
CSN: [id number]     Arrival date & time 07/28/15  2028 History   First MD Initiated Contact with Patient 07/28/15 2146     Chief Complaint  Patient presents with  . Fever     (Consider location/radiation/quality/duration/timing/severity/associated sxs/prior Treatment) Patient is a 81 y.o. female presenting with fever.  Fever Max temp prior to arrival:  102 Temp source:  Oral Onset quality:  Gradual Duration:  2 days Timing:  Constant Progression:  Unchanged Chronicity:  New Relieved by:  Nothing Ineffective treatments:  Acetaminophen (although pt reports pills were expired.) Associated symptoms: cough (mild), dysuria (and foul smelling urine.) and nausea   Associated symptoms: no chest pain and no vomiting     Past Medical History  Diagnosis Date  . Hypertension   . Arthritis   . Depression   . Diabetes mellitus without complication (HCC)   . GERD (gastroesophageal reflux disease)   . Osteoarthritis   . Insomnia   . Personal history of calcium pyrophosphate deposition disease (CPPD)     wrists and knees  . Osteoarthritis   . Diverticulosis   . Diverticulitis   . Actinic keratoses   . Fibromyalgia   . Lung nodule 2013    5 cm  . Renal disorder     renal insufficency   Past Surgical History  Procedure Laterality Date  . Thyroid surgery    . Shoulder surgery    . Back surgery    . Abdominal hysterectomy    . Cesarean section    . Knee surgery    . Tonsillectomy    . Cholecystectomy    . Eye surgery    . Appendectomy    . Foot neuroma surgery     Family History  Problem Relation Age of Onset  . Stroke Mother   . Heart disease Mother   . AAA (abdominal aortic aneurysm) Father    Social History  Substance Use Topics  . Smoking status: Never Smoker   . Smokeless tobacco: None  . Alcohol Use: Yes     Comment: rarely   OB History    No data available     Review of Systems  Constitutional: Positive for fever.  Respiratory: Positive for cough  (mild).   Cardiovascular: Negative for chest pain.  Gastrointestinal: Positive for nausea. Negative for vomiting.  Genitourinary: Positive for dysuria (and foul smelling urine.).  All other systems reviewed and are negative.     Allergies  Codeine; Hctz; Iodine; Iohexol; and Prednisone  Home Medications   Prior to Admission medications   Medication Sig Start Date End Date Taking? Authorizing Provider  acetaminophen (TYLENOL) 500 MG tablet Take 500 mg by mouth every 6 (six) hours as needed. Arthritis pain   Yes Historical Provider, MD  amLODipine (NORVASC) 5 MG tablet Take 5 mg by mouth daily.   Yes Historical Provider, MD  cetirizine (ZYRTEC) 10 MG tablet Take 10 mg by mouth daily.   Yes Historical Provider, MD  fluticasone (FLONASE) 50 MCG/ACT nasal spray Place 1 spray into both nostrils daily as needed for allergies.  05/17/15  Yes Historical Provider, MD  gabapentin (NEURONTIN) 300 MG capsule Take 300 mg by mouth 2 (two) times daily.  06/20/15  Yes Historical Provider, MD  ibuprofen (ADVIL,MOTRIN) 600 MG tablet Take 600 mg by mouth every 6 (six) hours as needed.   Yes Historical Provider, MD  losartan (COZAAR) 100 MG tablet Take 100 mg by mouth daily.   Yes Historical Provider, MD  metFORMIN (GLUCOPHAGE)  500 MG tablet Take by mouth 2 (two) times daily with a meal.   Yes Historical Provider, MD  pantoprazole (PROTONIX) 40 MG tablet Take 40 mg by mouth daily as needed (indigestion).    Yes Historical Provider, MD  simvastatin (ZOCOR) 20 MG tablet Take 20 mg by mouth daily.   Yes Historical Provider, MD  zolpidem (AMBIEN) 10 MG tablet Take 10 mg by mouth at bedtime as needed for sleep.   Yes Historical Provider, MD  aspirin EC 325 MG EC tablet Take 1 tablet (325 mg total) by mouth daily. Patient not taking: Reported on 07/28/2015 01/06/14   Marden Noble, MD  metroNIDAZOLE (METROGEL) 0.75 % gel Apply 1 application topically 2 (two) times daily.    Historical Provider, MD  sertraline (ZOLOFT)  100 MG tablet Take 1 tablet (100 mg total) by mouth daily. Patient not taking: Reported on 07/28/2015 01/06/14   Marden Noble, MD   BP 96/64 mmHg  Pulse 79  Temp(Src) 101.8 F (38.8 C) (Rectal)  Resp 24  SpO2 98% Physical Exam  Constitutional: She is oriented to person, place, and time. She appears well-developed and well-nourished. No distress.  HENT:  Head: Normocephalic and atraumatic.  Mouth/Throat: Oropharynx is clear and moist.  Eyes: Conjunctivae are normal. Pupils are equal, round, and reactive to light. No scleral icterus.  Neck: Neck supple.  Cardiovascular: Normal rate, regular rhythm, normal heart sounds and intact distal pulses.   No murmur heard. Pulmonary/Chest: Effort normal and breath sounds normal. No stridor. No respiratory distress. She has no rales.  Abdominal: Soft. Bowel sounds are normal. She exhibits no distension. There is no tenderness. There is no rebound and no guarding.  Musculoskeletal: Normal range of motion.  Neurological: She is alert and oriented to person, place, and time.  Skin: Skin is warm and dry. No rash noted.  Psychiatric: She has a normal mood and affect. Her behavior is normal.  Nursing note and vitals reviewed.   ED Course  Procedures (including critical care time) Labs Review Labs Reviewed  BASIC METABOLIC PANEL - Abnormal; Notable for the following:    Sodium 134 (*)    Glucose, Bld 173 (*)    All other components within normal limits  CBC  URINALYSIS, ROUTINE W REFLEX MICROSCOPIC (NOT AT [facility])    Imaging Review No results found. I have personally reviewed and evaluated these images and lab results as part of my medical decision-making.   EKG Interpretation None      MDM   Final diagnoses:  Fever, unspecified fever cause    81 yo female with fever since yesterday.  Reports frequent UTI's and urinary symptoms for a few days.  Also has mild cough.  In general, very well appearing.  Does not meet sepsis criteria and does  not appear ill.  I suspect UTI.  UA pending.  Will also check CXR due to her mild cough.  She had her flu shot and no myalgias to suggest flu. In general, she appears stable for outpatient treatment, pending results of tests.      Blake Divine, MD 07/30/15 [phone]

## 2015-07-29 NOTE — ED Provider Notes (Signed)
Patient continues to be well-appearing. Anxious to be discharged home. Stable vital signs. Chest x-ray without evidence of pneumonia. No definite evidence of a UTI on UA. We'll send for culture. Advised patient to take ibuprofen/Tylenol for fever. Follow-up with her primary physician. She understands return precautions  Loren Raceravid Einar Nolasco, MD 07/29/15 [phone]

## 2015-07-29 NOTE — Discharge Instructions (Signed)
Fever, Adult °A fever is an increase in the body's temperature. It is usually defined as a temperature of 100°F (38°C) or higher. Brief mild or moderate fevers generally have no long-term effects, and they often do not require treatment. Moderate or high fevers may make you feel uncomfortable and can sometimes be a sign of a serious illness or disease. The sweating that may occur with repeated or prolonged fever may also cause dehydration. °Fever is confirmed by taking a temperature with a thermometer. A measured temperature can vary with: °· Age. °· Time of day. °· Location of the thermometer: °· Mouth (oral). °· Rectum (rectal). °· Ear (tympanic). °· Underarm (axillary). °· Forehead (temporal). °HOME CARE INSTRUCTIONS °Pay attention to any changes in your symptoms. Take these actions to help with your condition: °· Take over-the counter and prescription medicines only as told by your health care provider. Follow the dosing instructions carefully. °· If you were prescribed an antibiotic medicine, take it as told by your health care provider. Do not stop taking the antibiotic even if you start to feel better. °· Rest as needed. °· Drink enough fluid to keep your urine clear or pale yellow. This helps to prevent dehydration. °· Sponge yourself or bathe with room-temperature water to help reduce your body temperature as needed. Do not use ice water. °· Do not overbundle yourself in blankets or heavy clothes. °SEEK MEDICAL CARE IF: °· You vomit. °· You cannot eat or drink without vomiting. °· You have diarrhea. °· You have pain when you urinate. °· Your symptoms do not improve with treatment. °· You develop new symptoms. °· You develop excessive weakness. °SEEK IMMEDIATE MEDICAL CARE IF: °· You have shortness of breath or have trouble breathing. °· You are dizzy or you faint. °· You are disoriented or confused. °· You develop signs of dehydration, such as a dry mouth, decreased urination, or paleness. °· You develop  severe pain in your abdomen. °· You have persistent vomiting or diarrhea. °· You develop a skin rash. °· Your symptoms suddenly get worse. °  °This information is not intended to replace advice given to you by your health care provider. Make sure you discuss any questions you have with your health care provider. °  °Document Released: 03/20/2001 Document Revised: 06/15/2015 Document Reviewed: 11/18/2014 °Elsevier Interactive Patient Education ©2016 Elsevier Inc[employer] °Upper Respiratory Infection, Adult °Most upper respiratory infections (URIs) are a viral infection of the air passages leading to the lungs. A URI affects the nose, throat, and upper air passages. The most common type of URI is nasopharyngitis and is typically referred to as "the common cold." °URIs run their course and usually go away on their own. Most of the time, a URI does not require medical attention, but sometimes a bacterial infection in the upper airways can follow a viral infection. This is called a secondary infection. Sinus and middle ear infections are common types of secondary upper respiratory infections. °Bacterial pneumonia can also complicate a URI. A URI can worsen asthma and chronic obstructive pulmonary disease (COPD). Sometimes, these complications can require emergency medical care and may be life threatening.  °CAUSES °Almost all URIs are caused by viruses. A virus is a type of germ and can spread from one person to another.  °RISKS FACTORS °You may be at risk for a URI if:  °· You smoke.   °· You have chronic heart or lung disease. °· You have a weakened defense (immune) system.   °· You are very young or   very old.   °· You have nasal allergies or asthma. °· You work in crowded or poorly ventilated areas. °· You work in health care facilities or schools. °SIGNS AND SYMPTOMS  °Symptoms typically develop 2-3 days after you come in contact with a cold virus. Most viral URIs last 7-10 days. However, viral URIs from the influenza virus  (flu virus) can last 14-18 days and are typically more severe. Symptoms may include:  °· Runny or stuffy (congested) nose.   °· Sneezing.   °· Cough.   °· Sore throat.   °· Headache.   °· Fatigue.   °· Fever.   °· Loss of appetite.   °· Pain in your forehead, behind your eyes, and over your cheekbones (sinus pain). °· Muscle aches.   °DIAGNOSIS  °Your health care provider may diagnose a URI by: °· Physical exam. °· Tests to check that your symptoms are not due to another condition such as: °¨ Strep throat. °¨ Sinusitis. °¨ Pneumonia. °¨ Asthma. °TREATMENT  °A URI goes away on its own with time. It cannot be cured with medicines, but medicines may be prescribed or recommended to relieve symptoms. Medicines may help: °· Reduce your fever. °· Reduce your cough. °· Relieve nasal congestion. °HOME CARE INSTRUCTIONS  °· Take medicines only as directed by your health care provider.   °· Gargle warm saltwater or take cough drops to comfort your throat as directed by your health care provider. °· Use a warm mist humidifier or inhale steam from a shower to increase air moisture. This may make it easier to breathe. °· Drink enough fluid to keep your urine clear or pale yellow.   °· Eat soups and other clear broths and maintain good nutrition.   °· Rest as needed.   °· Return to work when your temperature has returned to normal or as your health care provider advises. You may need to stay home longer to avoid infecting others. You can also use a face mask and careful hand washing to prevent spread of the virus. °· Increase the usage of your inhaler if you have asthma.   °· Do not use any tobacco products, including cigarettes, chewing tobacco, or electronic cigarettes. If you need help quitting, ask your health care provider. °PREVENTION  °The best way to protect yourself from getting a cold is to practice good hygiene.  °· Avoid oral or hand contact with people with cold symptoms.   °· Wash your hands often if contact occurs.    °There is no clear evidence that vitamin C, vitamin E, echinacea, or exercise reduces the chance of developing a cold. However, it is always recommended to get plenty of rest, exercise, and practice good nutrition.  °SEEK MEDICAL CARE IF:  °· You are getting worse rather than better.   °· Your symptoms are not controlled by medicine.   °· You have chills. °· You have worsening shortness of breath. °· You have brown or red mucus. °· You have yellow or brown nasal discharge. °· You have pain in your face, especially when you bend forward. °· You have a fever. °· You have swollen neck glands. °· You have pain while swallowing. °· You have white areas in the back of your throat. °SEEK IMMEDIATE MEDICAL CARE IF:  °· You have severe or persistent: °¨ Headache. °¨ Ear pain. °¨ Sinus pain. °¨ Chest pain. °· You have chronic lung disease and any of the following: °¨ Wheezing. °¨ Prolonged cough. °¨ Coughing up blood. °¨ A change in your usual mucus. °· You have a stiff neck. °· You have changes in   your: °¨ Vision. °¨ Hearing. °¨ Thinking. °¨ Mood. °MAKE SURE YOU:  °· Understand these instructions. °· Will watch your condition. °· Will get help right away if you are not doing well or get worse. °  °This information is not intended to replace advice given to you by your health care provider. Make sure you discuss any questions you have with your health care provider. °  °Document Released: 03/20/2001 Document Revised: 02/08/2015 Document Reviewed: 12/30/2013 °Elsevier Interactive Patient Education ©2016 [employer]vier Inc. ° °

## 2015-07-29 NOTE — ED Notes (Signed)
Patient transported to X-ray 

## 2018-06-07 ENCOUNTER — Emergency Department (HOSPITAL_COMMUNITY): Payer: Medicare Other

## 2018-06-07 ENCOUNTER — Other Ambulatory Visit: Payer: Self-pay

## 2018-06-07 ENCOUNTER — Emergency Department (HOSPITAL_COMMUNITY)
Admission: EM | Admit: 2018-06-07 | Discharge: 2018-06-07 | Disposition: A | Discharge status: Home / Self Care | Payer: Medicare Other | Attending: Emergency Medicine | Admitting: Emergency Medicine

## 2018-06-07 ENCOUNTER — Encounter (HOSPITAL_COMMUNITY): Payer: Self-pay

## 2018-06-07 DIAGNOSIS — W0110XA Fall on same level from slipping, tripping and stumbling with subsequent striking against unspecified object, initial encounter: Secondary | ICD-10-CM | POA: Diagnosis not present

## 2018-06-07 DIAGNOSIS — I10 Essential (primary) hypertension: Secondary | ICD-10-CM | POA: Diagnosis not present

## 2018-06-07 DIAGNOSIS — S20219A Contusion of unspecified front wall of thorax, initial encounter: Secondary | ICD-10-CM

## 2018-06-07 DIAGNOSIS — Z79899 Other long term (current) drug therapy: Secondary | ICD-10-CM | POA: Insufficient documentation

## 2018-06-07 DIAGNOSIS — Y999 Unspecified external cause status: Secondary | ICD-10-CM | POA: Insufficient documentation

## 2018-06-07 DIAGNOSIS — S20211A Contusion of right front wall of thorax, initial encounter: Secondary | ICD-10-CM | POA: Insufficient documentation

## 2018-06-07 DIAGNOSIS — Z7984 Long term (current) use of oral hypoglycemic drugs: Secondary | ICD-10-CM | POA: Diagnosis not present

## 2018-06-07 DIAGNOSIS — E119 Type 2 diabetes mellitus without complications: Secondary | ICD-10-CM | POA: Diagnosis not present

## 2018-06-07 DIAGNOSIS — S299XXA Unspecified injury of thorax, initial encounter: Secondary | ICD-10-CM | POA: Diagnosis present

## 2018-06-07 DIAGNOSIS — Y9301 Activity, walking, marching and hiking: Secondary | ICD-10-CM | POA: Diagnosis not present

## 2018-06-07 DIAGNOSIS — Y92000 Kitchen of unspecified non-institutional (private) residence as  the place of occurrence of the external cause: Secondary | ICD-10-CM | POA: Insufficient documentation

## 2018-06-07 NOTE — ED Notes (Signed)
Patient returned from CT

## 2018-06-07 NOTE — ED Notes (Signed)
Patient transported to CT 

## 2018-06-07 NOTE — ED Provider Notes (Signed)
[facility]-EMERGENCY DEPT Provider Note   CSN: [id number] Arrival date & time: 06/07/18  1622     History   Chief Complaint Chief Complaint  Patient presents with  . Fall    HPI Kristen Wagner is a 84 y.o. female.  84 year old female with past medical history below who presents with fall.  Earlier today, the patient was in her kitchen and tripped and fell, bumping her right side on a table.  She did not hit her head or lose consciousness.  She was on the floor for 15 or 20 minutes before she was able to contact family members to help her up.  She has been ambulatory since the event.  She reports mild pain in her right knee where she has had arthritis problems before and previous knee replacement.  She endorses mild pain only with movements of left hip.  She has moderate, constant pain in her left breast and right chest wall from bumping these areas during the fall.  No breathing problems.  She denies any neck, head, back, or abdominal pain.  No anticoagulant use.  Up-to-date on vaccinations.  The history is provided by the patient.  Fall     Past Medical History:  Diagnosis Date  . Actinic keratoses   . Arthritis   . Depression   . Diabetes mellitus without complication (HCC)   . Diverticulitis   . Diverticulosis   . Fibromyalgia   . GERD (gastroesophageal reflux disease)   . Hypertension   . Insomnia   . Lung nodule 2013   5 cm  . Osteoarthritis   . Osteoarthritis   . Personal history of calcium pyrophosphate deposition disease (CPPD)    wrists and knees  . Renal disorder    renal insufficency    Patient Active Problem List   Diagnosis Date Noted  . Frontal headache 01/05/2014  . Blurred vision 01/05/2014  . Left sided numbness 01/05/2014  . AV block 07/30/2012  . Second degree AV block, Mobitz type I 07/30/2012  . First degree AV block 07/30/2012  . Coronary artery calcification 07/30/2012  . S/P subtotal thyroidectomy 07/30/2012  .  Incidental lung nodule, > 3mm and < 8mm 07/30/2012  . Chest pain 07/29/2012  . OTH&UNSPEC NONINFECTIOUS GASTROENTERITIS&COLITIS 01/24/2010  . ISCHEMIC COLITIS 01/03/2010  . ABDOMINAL PAIN, LEFT LOWER QUADRANT 11/03/2009  . BLOOD IN STOOL, OCCULT 11/03/2009  . HYPERLIPIDEMIA 11/02/2009  . DEPRESSION 11/02/2009  . HYPERTENSION 11/02/2009  . ACID REFLUX DISEASE 11/02/2009  . DIVERTICULITIS, COLON 11/02/2009  . IRRITABLE BOWEL SYNDROME 11/02/2009  . GASTRIC ULCER, HX OF 11/02/2009    Past Surgical History:  Procedure Laterality Date  . ABDOMINAL HYSTERECTOMY    . APPENDECTOMY    . BACK SURGERY    . CESAREAN SECTION    . CHOLECYSTECTOMY    . EYE SURGERY    . FOOT NEUROMA SURGERY    . KNEE SURGERY    . SHOULDER SURGERY    . THYROID SURGERY    . TONSILLECTOMY       OB History   None      Home Medications    Prior to Admission medications   Medication Sig Start Date End Date Taking? Authorizing Provider  acetaminophen (TYLENOL) 500 MG tablet Take 500 mg by mouth every 6 (six) hours as needed. Arthritis pain   Yes [provider]  amLODipine (NORVASC) 5 MG tablet Take 5 mg by mouth daily.   Yes [provider]  cetirizine (ZYRTEC)  10 MG tablet Take 10 mg by mouth daily.   Yes [provider]  gabapentin (NEURONTIN) 300 MG capsule Take 300 mg by mouth daily.  06/20/15  Yes [provider]  ibuprofen (ADVIL,MOTRIN) 600 MG tablet Take 600 mg by mouth every 6 (six) hours as needed for moderate pain.    Yes [provider]  losartan (COZAAR) 25 MG tablet Take 25 mg by mouth daily.   Yes [provider]  metFORMIN (GLUCOPHAGE) 500 MG tablet Take 500 mg by mouth at bedtime.    Yes [provider]  metroNIDAZOLE (METROGEL) 0.75 % gel Apply 1 application topically daily.    Yes [provider]  pantoprazole (PROTONIX) 40 MG tablet Take 40 mg by mouth daily as needed (indigestion).    Yes [provider]    simvastatin (ZOCOR) 10 MG tablet Take 10 mg by mouth daily at 6 PM.  05/20/18  Yes [provider]  triamcinolone cream (KENALOG) 0.1 % Apply 1 application topically daily.  05/20/18  Yes [provider]  zolpidem (AMBIEN) 10 MG tablet Take 10 mg by mouth at bedtime.    Yes [provider]  aspirin EC 325 MG EC tablet Take 1 tablet (325 mg total) by mouth daily. Patient not taking: Reported on 07/28/2015 01/06/14   Marden NobleGates, Robert, MD  sertraline (ZOLOFT) 100 MG tablet Take 1 tablet (100 mg total) by mouth daily. Patient not taking: Reported on 07/28/2015 01/06/14   Marden NobleGates, Robert, MD    Family History Family History  Problem Relation Age of Onset  . Stroke Mother   . Heart disease Mother   . AAA (abdominal aortic aneurysm) Father     Social History Social History   Tobacco Use  . Smoking status: Never Smoker  . Smokeless tobacco: Never Used  Substance Use Topics  . Alcohol use: Not Currently    Comment: rarely  . Drug use: No     Allergies   Codeine; Hctz [hydrochlorothiazide]; Iodine; Iohexol; and Prednisone   Review of Systems Review of Systems All other systems reviewed and are negative except that which was mentioned in HPI   Physical Exam Updated Vital Signs BP 117/64   Pulse (!) 58   Temp 98.3 F (36.8 C) (Oral)   Resp 16   Ht 5\' 5"  (1.651 m)   Wt 72.6 kg   SpO2 99%   BMI 26.63 kg/m   Physical Exam  Constitutional: She is oriented to person, place, and time. She appears well-developed and well-nourished. No distress.  HENT:  Head: Normocephalic and atraumatic.  Moist mucous membranes  Eyes: Pupils are equal, round, and reactive to light. Conjunctivae are normal.  Neck: Neck supple.  Cardiovascular: Normal rate, regular rhythm and normal heart sounds.  No murmur heard. Pulmonary/Chest: Effort normal and breath sounds normal. She exhibits tenderness (R lateral lower chest wall, no crepitus).  Abdominal: Soft. Bowel sounds are  normal. She exhibits no distension. There is no tenderness.  Musculoskeletal: Normal range of motion. She exhibits no edema or deformity.  Mild tenderness R knee without significant effusion, normal ROM; normal ROM b/l hips  Neurological: She is alert and oriented to person, place, and time.  Fluent speech  Skin: Skin is warm and dry.  Abrasion L elbow  Psychiatric: She has a normal mood and affect. Judgment normal.  Nursing note and vitals reviewed.    ED Treatments / Results  Labs (all labs ordered are listed, but only abnormal results are displayed) Labs  Reviewed - No data to display  EKG None  Radiology Dg Chest 2 View  Result Date: 06/07/2018 CLINICAL DATA:  Pt fell today in front of her refrigerator. Is c/o pain to left breast/ribs s/p fall, as well as pain to right knee and left hip, limited movement to left hip s/p fall. H/o arthritis. EXAM: CHEST - 2 VIEW COMPARISON:  07/29/2015 FINDINGS: Heart size is UPPER normal and accentuated by AP technique. There is minimal LEFT basilar atelectasis. There are no focal consolidations or pleural effusions. No pulmonary edema. Degenerative changes are seen in the thoracic spine. There is chronic subacromial narrowing in both shoulders. Surgical clips are identified in the superior mediastinum. IMPRESSION: No evidence for acute  abnormality. Electronically Signed   By: Norva Pavlov M.D.   On: 06/07/2018 18:31   Ct Chest Wo Contrast  Result Date: 06/07/2018 CLINICAL DATA:  Pt states that she fell today in front of her refrigerator. Pt states she hit left breast and bilateral ribs. Pt states she was not dizzy when she fell. EXAM: CT CHEST WITHOUT CONTRAST TECHNIQUE: Multidetector CT imaging of the chest was performed following the standard protocol without IV contrast. COMPARISON:  Chest x-ray on 06/07/2018 and CT of the chest on 07/29/2012 FINDINGS: Cardiovascular: There is dense atherosclerotic calcification of the coronary arteries.  Atherosclerosis of the thoracic aorta not associated with aneurysm. Noncontrast appearance of pulmonary arteries is normal. Heart size is normal. Mediastinum/Nodes: Surgical clips are identified in the superior mediastinum, consistent with partial thyroidectomy. The RIGHT lobe of the thyroid is prominent without mass. No mediastinal, hilar, or axillary adenopathy. Small hiatal hernia. Otherwise the esophagus is normal in appearance. Lungs/Pleura: No pneumothorax. No consolidations/contusion. No pulmonary edema or pleural effusion. There are subsegmental atelectatic changes at the lung bases. There is a stable subpleural 5 millimeter nodule in the RIGHT middle lobe on image 93/7. Calcified granuloma identified in the RIGHT UPPER lobe. Upper Abdomen: A LEFT renal cyst is partially imaged, measuring at least 4.9 centimeters. Musculoskeletal: Degenerative changes are seen in the thoracic spine. No acute fractures. IMPRESSION: 1. No acute abnormalities of the chest. 2. Benign RIGHT pulmonary nodules. No further evaluation is necessary. 3.  Aortic atherosclerosis.  (ICD10-I70.0) 4. Coronary artery calcifications. 5. Benign LEFT renal cyst. 6. Small hiatal hernia. Electronically Signed   By: Norva Pavlov M.D.   On: 06/07/2018 20:05   Dg Knee Complete 4 Views Right  Result Date: 06/07/2018 CLINICAL DATA:  Pt fell today in front of her refrigerator. Is c/o pain to left breast/ribs s/p fall, as well as pain to right knee and left hip, limited movement to left hip s/p fall. H/o arthritis. EXAM: RIGHT KNEE - COMPLETE 4+ VIEW COMPARISON:  None. FINDINGS: There is significant degenerative change involving the MEDIAL, LATERAL, and patellofemoral compartments. Small joint effusion is present. There is no acute fracture or subluxation. IMPRESSION: Significant degenerative changes. Small joint effusion. Electronically Signed   By: Norva Pavlov M.D.   On: 06/07/2018 18:28   Dg Hip Unilat With Pelvis 2-3 Views  Left  Result Date: 06/07/2018 CLINICAL DATA:  Pt fell today in front of her refrigerator. Is c/o pain to left breast/ribs s/p fall, as well as pain to right knee and left hip, limited movement to left hip s/p fall. H/o arthritis. EXAM: DG HIP (WITH OR WITHOUT PELVIS) 2-3V LEFT COMPARISON:  None. FINDINGS: There are degenerative changes in the LOWER spine and bilateral hips. No acute fracture for subluxation. IMPRESSION: Significant degenerative changes. No evidence  for acute abnormality. Electronically Signed   By: Norva Pavlov M.D.   On: 06/07/2018 18:30    Procedures Procedures (including critical care time)  Medications Ordered in ED Medications - No data to display   Initial Impression / Assessment and Plan / ED Course  I have reviewed the triage vital signs and the nursing notes.  Pertinent labs that were available during my care of the patient were reviewed by me and considered in my medical decision making (see chart for details).    VSS and pt well appearing on exam.  She had left breast and right chest wall tenderness without crepitus or obvious deformity.  He has been ambulatory since the fall.  Plain films of left hip, knee, and chest negative.  Given several areas of chest wall tenderness and her advanced age, obtained chest CT to rule out occult fractures.  Chest CT was reassuring.  I discussed supportive measures for her pain.  She has a follow-up appointment with her PCP in a few days.  Extensively reviewed return precautions and she and daughter voiced understanding.  Final Clinical Impressions(s) / ED Diagnoses   Final diagnoses:  Contusion of chest wall, unspecified laterality, initial encounter    ED Discharge Orders    None       Sharmon Cheramie, Ambrose Finland, MD 06/07/18 2108

## 2018-06-07 NOTE — ED Triage Notes (Signed)
Pt states that she fell today in front of her refrigerator. Pt states she hit left breast and bilateral ribs. Pt states she was not dizzy when she fell.

## 2020-11-09 DIAGNOSIS — H9202 Otalgia, left ear: Secondary | ICD-10-CM | POA: Diagnosis not present

## 2020-11-29 DIAGNOSIS — M19079 Primary osteoarthritis, unspecified ankle and foot: Secondary | ICD-10-CM | POA: Diagnosis not present

## 2020-11-29 DIAGNOSIS — L84 Corns and callosities: Secondary | ICD-10-CM | POA: Diagnosis not present

## 2020-11-29 DIAGNOSIS — R269 Unspecified abnormalities of gait and mobility: Secondary | ICD-10-CM | POA: Diagnosis not present

## 2020-11-29 DIAGNOSIS — L603 Nail dystrophy: Secondary | ICD-10-CM | POA: Diagnosis not present

## 2020-11-29 DIAGNOSIS — B351 Tinea unguium: Secondary | ICD-10-CM | POA: Diagnosis not present

## 2020-11-29 DIAGNOSIS — M79673 Pain in unspecified foot: Secondary | ICD-10-CM | POA: Diagnosis not present

## 2020-12-01 DIAGNOSIS — Z79899 Other long term (current) drug therapy: Secondary | ICD-10-CM | POA: Diagnosis not present

## 2020-12-04 DIAGNOSIS — E1140 Type 2 diabetes mellitus with diabetic neuropathy, unspecified: Secondary | ICD-10-CM | POA: Diagnosis not present

## 2020-12-04 DIAGNOSIS — E1151 Type 2 diabetes mellitus with diabetic peripheral angiopathy without gangrene: Secondary | ICD-10-CM | POA: Diagnosis not present

## 2020-12-04 DIAGNOSIS — R531 Weakness: Secondary | ICD-10-CM | POA: Diagnosis not present

## 2020-12-04 DIAGNOSIS — N390 Urinary tract infection, site not specified: Secondary | ICD-10-CM | POA: Diagnosis not present

## 2020-12-04 DIAGNOSIS — N3281 Overactive bladder: Secondary | ICD-10-CM | POA: Diagnosis not present

## 2020-12-04 DIAGNOSIS — Z9181 History of falling: Secondary | ICD-10-CM | POA: Diagnosis not present

## 2020-12-04 DIAGNOSIS — R269 Unspecified abnormalities of gait and mobility: Secondary | ICD-10-CM | POA: Diagnosis not present

## 2020-12-04 DIAGNOSIS — E440 Moderate protein-calorie malnutrition: Secondary | ICD-10-CM | POA: Diagnosis not present

## 2020-12-04 DIAGNOSIS — I10 Essential (primary) hypertension: Secondary | ICD-10-CM | POA: Diagnosis not present

## 2020-12-06 DIAGNOSIS — M25562 Pain in left knee: Secondary | ICD-10-CM | POA: Diagnosis not present

## 2020-12-06 DIAGNOSIS — M170 Bilateral primary osteoarthritis of knee: Secondary | ICD-10-CM | POA: Diagnosis not present

## 2020-12-06 DIAGNOSIS — R2681 Unsteadiness on feet: Secondary | ICD-10-CM | POA: Diagnosis not present

## 2020-12-06 DIAGNOSIS — R278 Other lack of coordination: Secondary | ICD-10-CM | POA: Diagnosis not present

## 2020-12-06 DIAGNOSIS — M6281 Muscle weakness (generalized): Secondary | ICD-10-CM | POA: Diagnosis not present

## 2020-12-06 DIAGNOSIS — M25561 Pain in right knee: Secondary | ICD-10-CM | POA: Diagnosis not present

## 2020-12-07 DIAGNOSIS — R2681 Unsteadiness on feet: Secondary | ICD-10-CM | POA: Diagnosis not present

## 2020-12-07 DIAGNOSIS — M25562 Pain in left knee: Secondary | ICD-10-CM | POA: Diagnosis not present

## 2020-12-07 DIAGNOSIS — M170 Bilateral primary osteoarthritis of knee: Secondary | ICD-10-CM | POA: Diagnosis not present

## 2020-12-07 DIAGNOSIS — R278 Other lack of coordination: Secondary | ICD-10-CM | POA: Diagnosis not present

## 2020-12-07 DIAGNOSIS — M25561 Pain in right knee: Secondary | ICD-10-CM | POA: Diagnosis not present

## 2020-12-07 DIAGNOSIS — M6281 Muscle weakness (generalized): Secondary | ICD-10-CM | POA: Diagnosis not present

## 2020-12-08 DIAGNOSIS — M170 Bilateral primary osteoarthritis of knee: Secondary | ICD-10-CM | POA: Diagnosis not present

## 2020-12-08 DIAGNOSIS — R2681 Unsteadiness on feet: Secondary | ICD-10-CM | POA: Diagnosis not present

## 2020-12-08 DIAGNOSIS — M25562 Pain in left knee: Secondary | ICD-10-CM | POA: Diagnosis not present

## 2020-12-08 DIAGNOSIS — M6281 Muscle weakness (generalized): Secondary | ICD-10-CM | POA: Diagnosis not present

## 2020-12-08 DIAGNOSIS — M25561 Pain in right knee: Secondary | ICD-10-CM | POA: Diagnosis not present

## 2020-12-08 DIAGNOSIS — R278 Other lack of coordination: Secondary | ICD-10-CM | POA: Diagnosis not present

## 2020-12-09 DIAGNOSIS — M25561 Pain in right knee: Secondary | ICD-10-CM | POA: Diagnosis not present

## 2020-12-09 DIAGNOSIS — M25562 Pain in left knee: Secondary | ICD-10-CM | POA: Diagnosis not present

## 2020-12-09 DIAGNOSIS — R2681 Unsteadiness on feet: Secondary | ICD-10-CM | POA: Diagnosis not present

## 2020-12-09 DIAGNOSIS — M6281 Muscle weakness (generalized): Secondary | ICD-10-CM | POA: Diagnosis not present

## 2020-12-09 DIAGNOSIS — R278 Other lack of coordination: Secondary | ICD-10-CM | POA: Diagnosis not present

## 2020-12-09 DIAGNOSIS — M170 Bilateral primary osteoarthritis of knee: Secondary | ICD-10-CM | POA: Diagnosis not present

## 2020-12-12 DIAGNOSIS — R278 Other lack of coordination: Secondary | ICD-10-CM | POA: Diagnosis not present

## 2020-12-12 DIAGNOSIS — R2681 Unsteadiness on feet: Secondary | ICD-10-CM | POA: Diagnosis not present

## 2020-12-12 DIAGNOSIS — M25562 Pain in left knee: Secondary | ICD-10-CM | POA: Diagnosis not present

## 2020-12-12 DIAGNOSIS — M170 Bilateral primary osteoarthritis of knee: Secondary | ICD-10-CM | POA: Diagnosis not present

## 2020-12-12 DIAGNOSIS — M6281 Muscle weakness (generalized): Secondary | ICD-10-CM | POA: Diagnosis not present

## 2020-12-12 DIAGNOSIS — M25561 Pain in right knee: Secondary | ICD-10-CM | POA: Diagnosis not present

## 2020-12-13 DIAGNOSIS — M6281 Muscle weakness (generalized): Secondary | ICD-10-CM | POA: Diagnosis not present

## 2020-12-13 DIAGNOSIS — M170 Bilateral primary osteoarthritis of knee: Secondary | ICD-10-CM | POA: Diagnosis not present

## 2020-12-13 DIAGNOSIS — R278 Other lack of coordination: Secondary | ICD-10-CM | POA: Diagnosis not present

## 2020-12-13 DIAGNOSIS — R2681 Unsteadiness on feet: Secondary | ICD-10-CM | POA: Diagnosis not present

## 2020-12-13 DIAGNOSIS — M25562 Pain in left knee: Secondary | ICD-10-CM | POA: Diagnosis not present

## 2020-12-13 DIAGNOSIS — M25561 Pain in right knee: Secondary | ICD-10-CM | POA: Diagnosis not present

## 2020-12-14 DIAGNOSIS — M25562 Pain in left knee: Secondary | ICD-10-CM | POA: Diagnosis not present

## 2020-12-14 DIAGNOSIS — M6281 Muscle weakness (generalized): Secondary | ICD-10-CM | POA: Diagnosis not present

## 2020-12-14 DIAGNOSIS — M170 Bilateral primary osteoarthritis of knee: Secondary | ICD-10-CM | POA: Diagnosis not present

## 2020-12-14 DIAGNOSIS — M25561 Pain in right knee: Secondary | ICD-10-CM | POA: Diagnosis not present

## 2020-12-14 DIAGNOSIS — R2681 Unsteadiness on feet: Secondary | ICD-10-CM | POA: Diagnosis not present

## 2020-12-14 DIAGNOSIS — R278 Other lack of coordination: Secondary | ICD-10-CM | POA: Diagnosis not present

## 2020-12-15 DIAGNOSIS — M6281 Muscle weakness (generalized): Secondary | ICD-10-CM | POA: Diagnosis not present

## 2020-12-15 DIAGNOSIS — R278 Other lack of coordination: Secondary | ICD-10-CM | POA: Diagnosis not present

## 2020-12-15 DIAGNOSIS — R2681 Unsteadiness on feet: Secondary | ICD-10-CM | POA: Diagnosis not present

## 2020-12-15 DIAGNOSIS — M170 Bilateral primary osteoarthritis of knee: Secondary | ICD-10-CM | POA: Diagnosis not present

## 2020-12-15 DIAGNOSIS — M25562 Pain in left knee: Secondary | ICD-10-CM | POA: Diagnosis not present

## 2020-12-15 DIAGNOSIS — M25561 Pain in right knee: Secondary | ICD-10-CM | POA: Diagnosis not present

## 2020-12-16 DIAGNOSIS — R278 Other lack of coordination: Secondary | ICD-10-CM | POA: Diagnosis not present

## 2020-12-16 DIAGNOSIS — M25561 Pain in right knee: Secondary | ICD-10-CM | POA: Diagnosis not present

## 2020-12-16 DIAGNOSIS — R2681 Unsteadiness on feet: Secondary | ICD-10-CM | POA: Diagnosis not present

## 2020-12-16 DIAGNOSIS — M25562 Pain in left knee: Secondary | ICD-10-CM | POA: Diagnosis not present

## 2020-12-16 DIAGNOSIS — M6281 Muscle weakness (generalized): Secondary | ICD-10-CM | POA: Diagnosis not present

## 2020-12-16 DIAGNOSIS — M170 Bilateral primary osteoarthritis of knee: Secondary | ICD-10-CM | POA: Diagnosis not present

## 2020-12-19 DIAGNOSIS — M6281 Muscle weakness (generalized): Secondary | ICD-10-CM | POA: Diagnosis not present

## 2020-12-19 DIAGNOSIS — R2681 Unsteadiness on feet: Secondary | ICD-10-CM | POA: Diagnosis not present

## 2020-12-19 DIAGNOSIS — M25561 Pain in right knee: Secondary | ICD-10-CM | POA: Diagnosis not present

## 2020-12-19 DIAGNOSIS — M170 Bilateral primary osteoarthritis of knee: Secondary | ICD-10-CM | POA: Diagnosis not present

## 2020-12-19 DIAGNOSIS — M25562 Pain in left knee: Secondary | ICD-10-CM | POA: Diagnosis not present

## 2020-12-19 DIAGNOSIS — R278 Other lack of coordination: Secondary | ICD-10-CM | POA: Diagnosis not present

## 2020-12-20 DIAGNOSIS — M170 Bilateral primary osteoarthritis of knee: Secondary | ICD-10-CM | POA: Diagnosis not present

## 2020-12-20 DIAGNOSIS — M25561 Pain in right knee: Secondary | ICD-10-CM | POA: Diagnosis not present

## 2020-12-20 DIAGNOSIS — M25562 Pain in left knee: Secondary | ICD-10-CM | POA: Diagnosis not present

## 2020-12-20 DIAGNOSIS — R2681 Unsteadiness on feet: Secondary | ICD-10-CM | POA: Diagnosis not present

## 2020-12-20 DIAGNOSIS — M6281 Muscle weakness (generalized): Secondary | ICD-10-CM | POA: Diagnosis not present

## 2020-12-20 DIAGNOSIS — R278 Other lack of coordination: Secondary | ICD-10-CM | POA: Diagnosis not present

## 2020-12-22 DIAGNOSIS — R2681 Unsteadiness on feet: Secondary | ICD-10-CM | POA: Diagnosis not present

## 2020-12-22 DIAGNOSIS — M25562 Pain in left knee: Secondary | ICD-10-CM | POA: Diagnosis not present

## 2020-12-22 DIAGNOSIS — R278 Other lack of coordination: Secondary | ICD-10-CM | POA: Diagnosis not present

## 2020-12-22 DIAGNOSIS — M170 Bilateral primary osteoarthritis of knee: Secondary | ICD-10-CM | POA: Diagnosis not present

## 2020-12-22 DIAGNOSIS — M25561 Pain in right knee: Secondary | ICD-10-CM | POA: Diagnosis not present

## 2020-12-22 DIAGNOSIS — M6281 Muscle weakness (generalized): Secondary | ICD-10-CM | POA: Diagnosis not present

## 2020-12-27 DIAGNOSIS — R2681 Unsteadiness on feet: Secondary | ICD-10-CM | POA: Diagnosis not present

## 2020-12-27 DIAGNOSIS — M25562 Pain in left knee: Secondary | ICD-10-CM | POA: Diagnosis not present

## 2020-12-27 DIAGNOSIS — R278 Other lack of coordination: Secondary | ICD-10-CM | POA: Diagnosis not present

## 2020-12-27 DIAGNOSIS — M170 Bilateral primary osteoarthritis of knee: Secondary | ICD-10-CM | POA: Diagnosis not present

## 2020-12-27 DIAGNOSIS — M25561 Pain in right knee: Secondary | ICD-10-CM | POA: Diagnosis not present

## 2020-12-27 DIAGNOSIS — M6281 Muscle weakness (generalized): Secondary | ICD-10-CM | POA: Diagnosis not present

## 2021-01-19 DIAGNOSIS — Z79899 Other long term (current) drug therapy: Secondary | ICD-10-CM | POA: Diagnosis not present

## 2021-03-13 DIAGNOSIS — L603 Nail dystrophy: Secondary | ICD-10-CM | POA: Diagnosis not present

## 2021-03-13 DIAGNOSIS — E1140 Type 2 diabetes mellitus with diabetic neuropathy, unspecified: Secondary | ICD-10-CM | POA: Diagnosis not present

## 2021-03-20 DIAGNOSIS — Z79899 Other long term (current) drug therapy: Secondary | ICD-10-CM | POA: Diagnosis not present

## 2021-03-20 DIAGNOSIS — I10 Essential (primary) hypertension: Secondary | ICD-10-CM | POA: Diagnosis not present

## 2021-04-05 DIAGNOSIS — E119 Type 2 diabetes mellitus without complications: Secondary | ICD-10-CM | POA: Diagnosis not present

## 2021-04-05 DIAGNOSIS — Z7951 Long term (current) use of inhaled steroids: Secondary | ICD-10-CM | POA: Diagnosis not present

## 2021-04-05 DIAGNOSIS — Z961 Presence of intraocular lens: Secondary | ICD-10-CM | POA: Diagnosis not present

## 2021-04-05 DIAGNOSIS — Z7984 Long term (current) use of oral hypoglycemic drugs: Secondary | ICD-10-CM | POA: Diagnosis not present

## 2021-06-21 DIAGNOSIS — I10 Essential (primary) hypertension: Secondary | ICD-10-CM | POA: Diagnosis not present

## 2021-06-21 DIAGNOSIS — Z79899 Other long term (current) drug therapy: Secondary | ICD-10-CM | POA: Diagnosis not present

## 2021-07-13 DIAGNOSIS — E1151 Type 2 diabetes mellitus with diabetic peripheral angiopathy without gangrene: Secondary | ICD-10-CM | POA: Diagnosis not present

## 2021-07-13 DIAGNOSIS — L603 Nail dystrophy: Secondary | ICD-10-CM | POA: Diagnosis not present

## 2021-07-25 DIAGNOSIS — R293 Abnormal posture: Secondary | ICD-10-CM | POA: Diagnosis not present

## 2021-07-25 DIAGNOSIS — R279 Unspecified lack of coordination: Secondary | ICD-10-CM | POA: Diagnosis not present

## 2021-07-25 DIAGNOSIS — I10 Essential (primary) hypertension: Secondary | ICD-10-CM | POA: Diagnosis not present

## 2021-07-25 DIAGNOSIS — E1140 Type 2 diabetes mellitus with diabetic neuropathy, unspecified: Secondary | ICD-10-CM | POA: Diagnosis not present

## 2021-07-25 DIAGNOSIS — M170 Bilateral primary osteoarthritis of knee: Secondary | ICD-10-CM | POA: Diagnosis not present

## 2021-07-25 DIAGNOSIS — R2681 Unsteadiness on feet: Secondary | ICD-10-CM | POA: Diagnosis not present

## 2021-07-25 DIAGNOSIS — M6281 Muscle weakness (generalized): Secondary | ICD-10-CM | POA: Diagnosis not present

## 2021-07-26 DIAGNOSIS — M6281 Muscle weakness (generalized): Secondary | ICD-10-CM | POA: Diagnosis not present

## 2021-07-26 DIAGNOSIS — R279 Unspecified lack of coordination: Secondary | ICD-10-CM | POA: Diagnosis not present

## 2021-07-26 DIAGNOSIS — R2681 Unsteadiness on feet: Secondary | ICD-10-CM | POA: Diagnosis not present

## 2021-07-26 DIAGNOSIS — I10 Essential (primary) hypertension: Secondary | ICD-10-CM | POA: Diagnosis not present

## 2021-07-26 DIAGNOSIS — M170 Bilateral primary osteoarthritis of knee: Secondary | ICD-10-CM | POA: Diagnosis not present

## 2021-07-26 DIAGNOSIS — R293 Abnormal posture: Secondary | ICD-10-CM | POA: Diagnosis not present

## 2021-07-26 DIAGNOSIS — E1140 Type 2 diabetes mellitus with diabetic neuropathy, unspecified: Secondary | ICD-10-CM | POA: Diagnosis not present

## 2021-07-27 DIAGNOSIS — I10 Essential (primary) hypertension: Secondary | ICD-10-CM | POA: Diagnosis not present

## 2021-07-27 DIAGNOSIS — R2681 Unsteadiness on feet: Secondary | ICD-10-CM | POA: Diagnosis not present

## 2021-07-27 DIAGNOSIS — M170 Bilateral primary osteoarthritis of knee: Secondary | ICD-10-CM | POA: Diagnosis not present

## 2021-07-27 DIAGNOSIS — R293 Abnormal posture: Secondary | ICD-10-CM | POA: Diagnosis not present

## 2021-07-27 DIAGNOSIS — M6281 Muscle weakness (generalized): Secondary | ICD-10-CM | POA: Diagnosis not present

## 2021-07-27 DIAGNOSIS — R279 Unspecified lack of coordination: Secondary | ICD-10-CM | POA: Diagnosis not present

## 2021-07-27 DIAGNOSIS — E1140 Type 2 diabetes mellitus with diabetic neuropathy, unspecified: Secondary | ICD-10-CM | POA: Diagnosis not present

## 2021-07-28 DIAGNOSIS — R279 Unspecified lack of coordination: Secondary | ICD-10-CM | POA: Diagnosis not present

## 2021-07-28 DIAGNOSIS — E1140 Type 2 diabetes mellitus with diabetic neuropathy, unspecified: Secondary | ICD-10-CM | POA: Diagnosis not present

## 2021-07-28 DIAGNOSIS — I10 Essential (primary) hypertension: Secondary | ICD-10-CM | POA: Diagnosis not present

## 2021-07-28 DIAGNOSIS — M6281 Muscle weakness (generalized): Secondary | ICD-10-CM | POA: Diagnosis not present

## 2021-07-28 DIAGNOSIS — R293 Abnormal posture: Secondary | ICD-10-CM | POA: Diagnosis not present

## 2021-07-28 DIAGNOSIS — R2681 Unsteadiness on feet: Secondary | ICD-10-CM | POA: Diagnosis not present

## 2021-07-28 DIAGNOSIS — M170 Bilateral primary osteoarthritis of knee: Secondary | ICD-10-CM | POA: Diagnosis not present

## 2021-07-31 DIAGNOSIS — R293 Abnormal posture: Secondary | ICD-10-CM | POA: Diagnosis not present

## 2021-07-31 DIAGNOSIS — E1140 Type 2 diabetes mellitus with diabetic neuropathy, unspecified: Secondary | ICD-10-CM | POA: Diagnosis not present

## 2021-07-31 DIAGNOSIS — M170 Bilateral primary osteoarthritis of knee: Secondary | ICD-10-CM | POA: Diagnosis not present

## 2021-07-31 DIAGNOSIS — I10 Essential (primary) hypertension: Secondary | ICD-10-CM | POA: Diagnosis not present

## 2021-07-31 DIAGNOSIS — M6281 Muscle weakness (generalized): Secondary | ICD-10-CM | POA: Diagnosis not present

## 2021-07-31 DIAGNOSIS — R2681 Unsteadiness on feet: Secondary | ICD-10-CM | POA: Diagnosis not present

## 2021-07-31 DIAGNOSIS — R279 Unspecified lack of coordination: Secondary | ICD-10-CM | POA: Diagnosis not present

## 2021-08-01 DIAGNOSIS — M170 Bilateral primary osteoarthritis of knee: Secondary | ICD-10-CM | POA: Diagnosis not present

## 2021-08-01 DIAGNOSIS — E1140 Type 2 diabetes mellitus with diabetic neuropathy, unspecified: Secondary | ICD-10-CM | POA: Diagnosis not present

## 2021-08-01 DIAGNOSIS — I10 Essential (primary) hypertension: Secondary | ICD-10-CM | POA: Diagnosis not present

## 2021-08-01 DIAGNOSIS — R2681 Unsteadiness on feet: Secondary | ICD-10-CM | POA: Diagnosis not present

## 2021-08-01 DIAGNOSIS — R279 Unspecified lack of coordination: Secondary | ICD-10-CM | POA: Diagnosis not present

## 2021-08-01 DIAGNOSIS — M6281 Muscle weakness (generalized): Secondary | ICD-10-CM | POA: Diagnosis not present

## 2021-08-01 DIAGNOSIS — R293 Abnormal posture: Secondary | ICD-10-CM | POA: Diagnosis not present

## 2021-08-02 DIAGNOSIS — M6281 Muscle weakness (generalized): Secondary | ICD-10-CM | POA: Diagnosis not present

## 2021-08-02 DIAGNOSIS — E1140 Type 2 diabetes mellitus with diabetic neuropathy, unspecified: Secondary | ICD-10-CM | POA: Diagnosis not present

## 2021-08-02 DIAGNOSIS — R2681 Unsteadiness on feet: Secondary | ICD-10-CM | POA: Diagnosis not present

## 2021-08-02 DIAGNOSIS — I10 Essential (primary) hypertension: Secondary | ICD-10-CM | POA: Diagnosis not present

## 2021-08-02 DIAGNOSIS — R279 Unspecified lack of coordination: Secondary | ICD-10-CM | POA: Diagnosis not present

## 2021-08-02 DIAGNOSIS — R293 Abnormal posture: Secondary | ICD-10-CM | POA: Diagnosis not present

## 2021-08-02 DIAGNOSIS — M170 Bilateral primary osteoarthritis of knee: Secondary | ICD-10-CM | POA: Diagnosis not present

## 2021-08-03 DIAGNOSIS — I10 Essential (primary) hypertension: Secondary | ICD-10-CM | POA: Diagnosis not present

## 2021-08-03 DIAGNOSIS — R2681 Unsteadiness on feet: Secondary | ICD-10-CM | POA: Diagnosis not present

## 2021-08-03 DIAGNOSIS — R279 Unspecified lack of coordination: Secondary | ICD-10-CM | POA: Diagnosis not present

## 2021-08-03 DIAGNOSIS — R293 Abnormal posture: Secondary | ICD-10-CM | POA: Diagnosis not present

## 2021-08-03 DIAGNOSIS — M170 Bilateral primary osteoarthritis of knee: Secondary | ICD-10-CM | POA: Diagnosis not present

## 2021-08-03 DIAGNOSIS — M6281 Muscle weakness (generalized): Secondary | ICD-10-CM | POA: Diagnosis not present

## 2021-08-03 DIAGNOSIS — E1140 Type 2 diabetes mellitus with diabetic neuropathy, unspecified: Secondary | ICD-10-CM | POA: Diagnosis not present

## 2021-08-07 DIAGNOSIS — I10 Essential (primary) hypertension: Secondary | ICD-10-CM | POA: Diagnosis not present

## 2021-08-07 DIAGNOSIS — R2681 Unsteadiness on feet: Secondary | ICD-10-CM | POA: Diagnosis not present

## 2021-08-07 DIAGNOSIS — M6281 Muscle weakness (generalized): Secondary | ICD-10-CM | POA: Diagnosis not present

## 2021-08-07 DIAGNOSIS — E1140 Type 2 diabetes mellitus with diabetic neuropathy, unspecified: Secondary | ICD-10-CM | POA: Diagnosis not present

## 2021-08-07 DIAGNOSIS — R279 Unspecified lack of coordination: Secondary | ICD-10-CM | POA: Diagnosis not present

## 2021-08-07 DIAGNOSIS — R293 Abnormal posture: Secondary | ICD-10-CM | POA: Diagnosis not present

## 2021-08-07 DIAGNOSIS — M170 Bilateral primary osteoarthritis of knee: Secondary | ICD-10-CM | POA: Diagnosis not present

## 2021-08-08 DIAGNOSIS — M170 Bilateral primary osteoarthritis of knee: Secondary | ICD-10-CM | POA: Diagnosis not present

## 2021-08-08 DIAGNOSIS — R2681 Unsteadiness on feet: Secondary | ICD-10-CM | POA: Diagnosis not present

## 2021-08-08 DIAGNOSIS — I10 Essential (primary) hypertension: Secondary | ICD-10-CM | POA: Diagnosis not present

## 2021-08-08 DIAGNOSIS — M6281 Muscle weakness (generalized): Secondary | ICD-10-CM | POA: Diagnosis not present

## 2021-08-08 DIAGNOSIS — R279 Unspecified lack of coordination: Secondary | ICD-10-CM | POA: Diagnosis not present

## 2021-08-08 DIAGNOSIS — E1140 Type 2 diabetes mellitus with diabetic neuropathy, unspecified: Secondary | ICD-10-CM | POA: Diagnosis not present

## 2021-08-09 DIAGNOSIS — E1140 Type 2 diabetes mellitus with diabetic neuropathy, unspecified: Secondary | ICD-10-CM | POA: Diagnosis not present

## 2021-08-09 DIAGNOSIS — M6281 Muscle weakness (generalized): Secondary | ICD-10-CM | POA: Diagnosis not present

## 2021-08-09 DIAGNOSIS — I10 Essential (primary) hypertension: Secondary | ICD-10-CM | POA: Diagnosis not present

## 2021-08-09 DIAGNOSIS — R2681 Unsteadiness on feet: Secondary | ICD-10-CM | POA: Diagnosis not present

## 2021-08-09 DIAGNOSIS — R279 Unspecified lack of coordination: Secondary | ICD-10-CM | POA: Diagnosis not present

## 2021-08-09 DIAGNOSIS — M170 Bilateral primary osteoarthritis of knee: Secondary | ICD-10-CM | POA: Diagnosis not present

## 2021-08-10 DIAGNOSIS — M170 Bilateral primary osteoarthritis of knee: Secondary | ICD-10-CM | POA: Diagnosis not present

## 2021-08-10 DIAGNOSIS — E1140 Type 2 diabetes mellitus with diabetic neuropathy, unspecified: Secondary | ICD-10-CM | POA: Diagnosis not present

## 2021-08-10 DIAGNOSIS — R2681 Unsteadiness on feet: Secondary | ICD-10-CM | POA: Diagnosis not present

## 2021-08-10 DIAGNOSIS — M6281 Muscle weakness (generalized): Secondary | ICD-10-CM | POA: Diagnosis not present

## 2021-08-10 DIAGNOSIS — I10 Essential (primary) hypertension: Secondary | ICD-10-CM | POA: Diagnosis not present

## 2021-08-10 DIAGNOSIS — R279 Unspecified lack of coordination: Secondary | ICD-10-CM | POA: Diagnosis not present

## 2021-08-11 DIAGNOSIS — M170 Bilateral primary osteoarthritis of knee: Secondary | ICD-10-CM | POA: Diagnosis not present

## 2021-08-11 DIAGNOSIS — R2681 Unsteadiness on feet: Secondary | ICD-10-CM | POA: Diagnosis not present

## 2021-08-11 DIAGNOSIS — R279 Unspecified lack of coordination: Secondary | ICD-10-CM | POA: Diagnosis not present

## 2021-08-11 DIAGNOSIS — M6281 Muscle weakness (generalized): Secondary | ICD-10-CM | POA: Diagnosis not present

## 2021-08-11 DIAGNOSIS — E1140 Type 2 diabetes mellitus with diabetic neuropathy, unspecified: Secondary | ICD-10-CM | POA: Diagnosis not present

## 2021-08-11 DIAGNOSIS — I10 Essential (primary) hypertension: Secondary | ICD-10-CM | POA: Diagnosis not present

## 2021-08-14 DIAGNOSIS — E1140 Type 2 diabetes mellitus with diabetic neuropathy, unspecified: Secondary | ICD-10-CM | POA: Diagnosis not present

## 2021-08-14 DIAGNOSIS — M6281 Muscle weakness (generalized): Secondary | ICD-10-CM | POA: Diagnosis not present

## 2021-08-14 DIAGNOSIS — R279 Unspecified lack of coordination: Secondary | ICD-10-CM | POA: Diagnosis not present

## 2021-08-14 DIAGNOSIS — I10 Essential (primary) hypertension: Secondary | ICD-10-CM | POA: Diagnosis not present

## 2021-08-14 DIAGNOSIS — R2681 Unsteadiness on feet: Secondary | ICD-10-CM | POA: Diagnosis not present

## 2021-08-14 DIAGNOSIS — M170 Bilateral primary osteoarthritis of knee: Secondary | ICD-10-CM | POA: Diagnosis not present

## 2021-08-15 DIAGNOSIS — M6281 Muscle weakness (generalized): Secondary | ICD-10-CM | POA: Diagnosis not present

## 2021-08-15 DIAGNOSIS — R2681 Unsteadiness on feet: Secondary | ICD-10-CM | POA: Diagnosis not present

## 2021-08-15 DIAGNOSIS — R279 Unspecified lack of coordination: Secondary | ICD-10-CM | POA: Diagnosis not present

## 2021-08-15 DIAGNOSIS — M170 Bilateral primary osteoarthritis of knee: Secondary | ICD-10-CM | POA: Diagnosis not present

## 2021-08-15 DIAGNOSIS — Z79899 Other long term (current) drug therapy: Secondary | ICD-10-CM | POA: Diagnosis not present

## 2021-08-15 DIAGNOSIS — I10 Essential (primary) hypertension: Secondary | ICD-10-CM | POA: Diagnosis not present

## 2021-08-15 DIAGNOSIS — E1140 Type 2 diabetes mellitus with diabetic neuropathy, unspecified: Secondary | ICD-10-CM | POA: Diagnosis not present

## 2021-08-17 ENCOUNTER — Other Ambulatory Visit: Payer: Self-pay

## 2021-08-17 ENCOUNTER — Emergency Department (HOSPITAL_COMMUNITY): Payer: Medicare Other

## 2021-08-17 ENCOUNTER — Emergency Department (HOSPITAL_COMMUNITY)
Admission: EM | Admit: 2021-08-17 | Discharge: 2021-08-17 | Disposition: A | Discharge status: Home / Self Care | Payer: Medicare Other | Attending: Emergency Medicine | Admitting: Emergency Medicine

## 2021-08-17 DIAGNOSIS — R0789 Other chest pain: Secondary | ICD-10-CM | POA: Diagnosis not present

## 2021-08-17 DIAGNOSIS — Z7984 Long term (current) use of oral hypoglycemic drugs: Secondary | ICD-10-CM | POA: Diagnosis not present

## 2021-08-17 DIAGNOSIS — E119 Type 2 diabetes mellitus without complications: Secondary | ICD-10-CM | POA: Insufficient documentation

## 2021-08-17 DIAGNOSIS — R7989 Other specified abnormal findings of blood chemistry: Secondary | ICD-10-CM | POA: Diagnosis not present

## 2021-08-17 DIAGNOSIS — Z7982 Long term (current) use of aspirin: Secondary | ICD-10-CM | POA: Insufficient documentation

## 2021-08-17 DIAGNOSIS — R000 Tachycardia, unspecified: Secondary | ICD-10-CM | POA: Diagnosis not present

## 2021-08-17 DIAGNOSIS — R300 Dysuria: Secondary | ICD-10-CM | POA: Insufficient documentation

## 2021-08-17 DIAGNOSIS — Z79899 Other long term (current) drug therapy: Secondary | ICD-10-CM | POA: Diagnosis not present

## 2021-08-17 DIAGNOSIS — I700 Atherosclerosis of aorta: Secondary | ICD-10-CM | POA: Diagnosis not present

## 2021-08-17 DIAGNOSIS — I10 Essential (primary) hypertension: Secondary | ICD-10-CM | POA: Insufficient documentation

## 2021-08-17 DIAGNOSIS — Z743 Need for continuous supervision: Secondary | ICD-10-CM | POA: Diagnosis not present

## 2021-08-17 DIAGNOSIS — R110 Nausea: Secondary | ICD-10-CM | POA: Insufficient documentation

## 2021-08-17 DIAGNOSIS — N3090 Cystitis, unspecified without hematuria: Secondary | ICD-10-CM | POA: Diagnosis not present

## 2021-08-17 DIAGNOSIS — R42 Dizziness and giddiness: Secondary | ICD-10-CM | POA: Insufficient documentation

## 2021-08-17 DIAGNOSIS — R6889 Other general symptoms and signs: Secondary | ICD-10-CM | POA: Diagnosis not present

## 2021-08-17 DIAGNOSIS — I2510 Atherosclerotic heart disease of native coronary artery without angina pectoris: Secondary | ICD-10-CM | POA: Diagnosis not present

## 2021-08-17 DIAGNOSIS — R739 Hyperglycemia, unspecified: Secondary | ICD-10-CM | POA: Diagnosis not present

## 2021-08-17 DIAGNOSIS — I517 Cardiomegaly: Secondary | ICD-10-CM | POA: Diagnosis not present

## 2021-08-17 DIAGNOSIS — R079 Chest pain, unspecified: Secondary | ICD-10-CM

## 2021-08-17 DIAGNOSIS — M5134 Other intervertebral disc degeneration, thoracic region: Secondary | ICD-10-CM | POA: Diagnosis not present

## 2021-08-17 LAB — CBC
HCT: 34.4 % — ABNORMAL LOW (ref 36.0–46.0)
Hemoglobin: 10.6 g/dL — ABNORMAL LOW (ref 12.0–15.0)
MCH: 27.7 pg (ref 26.0–34.0)
MCHC: 30.8 g/dL (ref 30.0–36.0)
MCV: 90.1 fL (ref 80.0–100.0)
Platelets: 261 10*3/uL (ref 150–400)
RBC: 3.82 MIL/uL — ABNORMAL LOW (ref 3.87–5.11)
RDW: 15.6 % — ABNORMAL HIGH (ref 11.5–15.5)
WBC: 13.3 10*3/uL — ABNORMAL HIGH (ref 4.0–10.5)
nRBC: 0 % (ref 0.0–0.2)

## 2021-08-17 LAB — COMPREHENSIVE METABOLIC PANEL
ALT: 10 U/L (ref 0–44)
AST: 13 U/L — ABNORMAL LOW (ref 15–41)
Albumin: 2.8 g/dL — ABNORMAL LOW (ref 3.5–5.0)
Alkaline Phosphatase: 97 U/L (ref 38–126)
Anion gap: 8 (ref 5–15)
BUN: 17 mg/dL (ref 8–23)
CO2: 22 mmol/L (ref 22–32)
Calcium: 8.3 mg/dL — ABNORMAL LOW (ref 8.9–10.3)
Chloride: 99 mmol/L (ref 98–111)
Creatinine, Ser: 0.88 mg/dL (ref 0.44–1.00)
GFR, Estimated: >60 mL/min (ref >60)
Glucose, Bld: 244 mg/dL — ABNORMAL HIGH (ref 70–99)
Potassium: 4.4 mmol/L (ref 3.5–5.1)
Sodium: 129 mmol/L — ABNORMAL LOW (ref 135–145)
Total Bilirubin: 0.6 mg/dL (ref 0.3–1.2)
Total Protein: 7.3 g/dL (ref 6.5–8.1)

## 2021-08-17 LAB — URINALYSIS, ROUTINE W REFLEX MICROSCOPIC
Bilirubin Urine: NEGATIVE
Glucose, UA: NEGATIVE mg/dL
Ketones, ur: NEGATIVE mg/dL
Nitrite: POSITIVE — AB
Protein, ur: NEGATIVE mg/dL
Specific Gravity, Urine: 1.011 (ref 1.005–1.030)
pH: 6 (ref 5.0–8.0)

## 2021-08-17 LAB — TROPONIN I (HIGH SENSITIVITY)
Troponin I (High Sensitivity): 7 ng/L (ref <18)
Troponin I (High Sensitivity): 7 ng/L (ref <18)

## 2021-08-17 LAB — CBG MONITORING, ED: Glucose-Capillary: 257 mg/dL — ABNORMAL HIGH (ref 70–99)

## 2021-08-17 LAB — D-DIMER, QUANTITATIVE: D-Dimer, Quant: 3.15 ug/mL-FEU — ABNORMAL HIGH (ref 0.00–0.50)

## 2021-08-17 MED ORDER — FOSFOMYCIN TROMETHAMINE 3 G PO PACK
3.0000 g | PACK | Freq: Once | ORAL | Status: AC
  Administered 2021-08-17: 3 g via ORAL
  Filled 2021-08-17: qty 3

## 2021-08-17 MED ORDER — MAGNESIUM OXIDE -MG SUPPLEMENT 400 (240 MG) MG PO TABS
800.0000 mg | ORAL_TABLET | Freq: Once | ORAL | Status: AC
  Administered 2021-08-17: 800 mg via ORAL
  Filled 2021-08-17: qty 2

## 2021-08-17 MED ORDER — ASPIRIN 81 MG PO CHEW
324.0000 mg | CHEWABLE_TABLET | Freq: Once | ORAL | Status: AC
  Administered 2021-08-17: 324 mg via ORAL
  Filled 2021-08-17: qty 4

## 2021-08-17 MED ORDER — DIPHENHYDRAMINE HCL 50 MG/ML IJ SOLN
50.0000 mg | Freq: Once | INTRAMUSCULAR | Status: AC
  Administered 2021-08-17: 50 mg via INTRAVENOUS
  Filled 2021-08-17: qty 1

## 2021-08-17 MED ORDER — HYDROCORTISONE SOD SUC (PF) 250 MG IJ SOLR
200.0000 mg | Freq: Once | INTRAMUSCULAR | Status: AC
  Administered 2021-08-17: 200 mg via INTRAVENOUS
  Filled 2021-08-17: qty 200

## 2021-08-17 MED ORDER — DIPHENHYDRAMINE HCL 25 MG PO CAPS: 50.0000 mg | ORAL_CAPSULE | Freq: Once | ORAL | Status: AC

## 2021-08-17 MED ORDER — IOHEXOL 350 MG/ML SOLN
50.0000 mL | Freq: Once | INTRAVENOUS | Status: AC | PRN
  Administered 2021-08-17: 50 mL via INTRAVENOUS

## 2021-08-17 MED ORDER — ONDANSETRON HCL 4 MG/2ML IJ SOLN
4.0000 mg | Freq: Once | INTRAMUSCULAR | Status: AC
  Administered 2021-08-17: 4 mg via INTRAVENOUS
  Filled 2021-08-17: qty 2

## 2021-08-17 NOTE — ED Provider Notes (Signed)
  Physical Exam  BP 100/74   Pulse 80   Temp 97.8 F (36.6 C) (Oral)   Resp (!) 21   SpO2 91%   Physical Exam Vitals and nursing note reviewed.  Constitutional:      General: She is not in acute distress.    Appearance: She is well-developed.  HENT:     Head: Normocephalic and atraumatic.  Eyes:     Conjunctiva/sclera: Conjunctivae normal.  Cardiovascular:     Rate and Rhythm: Normal rate and regular rhythm.     Heart sounds: No murmur heard. Pulmonary:     Effort: Pulmonary effort is normal. No respiratory distress.     Breath sounds: Normal breath sounds.  Abdominal:     Palpations: Abdomen is soft.     Tenderness: There is no abdominal tenderness.  Musculoskeletal:     Cervical back: Neck supple.  Skin:    General: Skin is warm and dry.  Neurological:     Mental Status: She is alert.    ED Course/Procedures   Clinical Course as of 08/17/21 1515  Thu Aug 17, 2021  0703 CP r/o, dysuira, pending CTPE with premed protocol [MK]  0706 DNR [MK]    Clinical Course User Index [MK] Saidi Santacroce, Wyn Forster, MD    Procedures  MDM   Patient received in handoff.  Initial complaints of chest pain and dysuria.  At time of signout, patient had multiple EKGs with dynamic inferior ST changes and a dimer of 3.15.  Pending CT PE.  PE study obtained with no evidence of pulmonary embolism.  I called cardiology and spoke with him about the patient's EKG and it appears this morphology has more PR depression than true ST elevation.  Cardiology [facility]) agrees that in the absence of concomitant ST depressions, this presentation does not fit with acute coronary syndrome.  On reevaluation, the patient has no complaints of chest pain has not had complaints of chest pain for multiple hours.  Urinalysis is concerning for acute cystitis.  She was treated with fosfomycin and will be discharged back to her facility with outpatient cardiology follow-up.  She was given strict return precautions in regards to  her chest pain of which her family voiced understanding.  Patient then discharged.       Glendora Score, MD 08/17/21 [phone]

## 2021-08-17 NOTE — ED Triage Notes (Signed)
Pt BIB EMS from [facility] for chset pain on the left side radiating to the arm. Pt also complains of burning when she pees and has a Hx of UTI. Ems report warm to the touch   2 nitro, 4mg  Zofran, 750 NS and 325 aspirin given PTA   100 Hr     98 Hr  106/60    126/80 CO2 40 Respiration 30  100.1 F

## 2021-08-17 NOTE — ED Provider Notes (Signed)
[facility] EMERGENCY DEPARTMENT Provider Note   CSN: [id number] Arrival date & time: 08/17/21  0768     History Chief Complaint  Patient presents with   Dysuria   Chest Pain    REALITY DEJONGE is a 87 y.o. female.  Patient presents from her facility for chest pain.  Patient states that she started having some left-sided chest pain radiated down her arm felt little bit like pulling and pressure about couple hours prior to arrival.  Last about an hour but is gone at this time.  Patient states that she has some nausea and a little bit of lightheadedness was happening but no dyspnea, diaphoresis or syncope.  It is seem to radiate to her left arm.  She states to the triage nurse that she also has dysuria but denies that it this time to me.   Dysuria Chest Pain     Past Medical History:  Diagnosis Date   Actinic keratoses    Arthritis    Depression    Diabetes mellitus without complication (HCC)    Diverticulitis    Diverticulosis    Fibromyalgia    GERD (gastroesophageal reflux disease)    Hypertension    Insomnia    Lung nodule 2013   5 cm   Osteoarthritis    Osteoarthritis    Personal history of calcium pyrophosphate deposition disease (CPPD)    wrists and knees   Renal disorder    renal insufficency    Patient Active Problem List   Diagnosis Date Noted   Frontal headache 01/05/2014   Blurred vision 01/05/2014   Left sided numbness 01/05/2014   AV block 07/30/2012   Second degree AV block, Mobitz type I 07/30/2012   First degree AV block 07/30/2012   Coronary artery calcification 07/30/2012   S/P subtotal thyroidectomy 07/30/2012   Incidental lung nodule, > 74mm and < 60mm 07/30/2012   Chest pain 07/29/2012   OTH&UNSPEC NONINFECTIOUS GASTROENTERITIS&COLITIS 01/24/2010   ISCHEMIC COLITIS 01/03/2010   ABDOMINAL PAIN, LEFT LOWER QUADRANT 11/03/2009   BLOOD IN STOOL, OCCULT 11/03/2009   HYPERLIPIDEMIA 11/02/2009   DEPRESSION 11/02/2009    HYPERTENSION 11/02/2009   ACID REFLUX DISEASE 11/02/2009   DIVERTICULITIS, COLON 11/02/2009   IRRITABLE BOWEL SYNDROME 11/02/2009   GASTRIC ULCER, HX OF 11/02/2009    Past Surgical History:  Procedure Laterality Date   ABDOMINAL HYSTERECTOMY     APPENDECTOMY     BACK SURGERY     CESAREAN SECTION     CHOLECYSTECTOMY     EYE SURGERY     FOOT NEUROMA SURGERY     KNEE SURGERY     SHOULDER SURGERY     THYROID SURGERY     TONSILLECTOMY       OB History   No obstetric history on file.     Family History  Problem Relation Age of Onset   Stroke Mother    Heart disease Mother    AAA (abdominal aortic aneurysm) Father     Social History   Tobacco Use   Smoking status: Never   Smokeless tobacco: Never  Substance Use Topics   Alcohol use: Not Currently    Comment: rarely   Drug use: No    Home Medications Prior to Admission medications   Medication Sig Start Date End Date Taking? Authorizing Provider  acetaminophen (TYLENOL) 325 MG tablet Take 650 mg by mouth every 4 (four) hours as needed for moderate pain.   Yes [provider]  amLODipine (NORVASC)  5 MG tablet Take 5 mg by mouth daily.   Yes [provider]  blood glucose meter kit and supplies by Other route as directed. Check fasting CBG on Mondays and thursdays   Yes [provider]  calcium elemental as carbonate (ANTACID ULTRA STRENGTH) 400 MG chewable tablet Chew 2,000 mg by mouth every 8 (eight) hours as needed for heartburn (indigestion).   Yes [provider]  fluticasone (FLONASE) 50 MCG/ACT nasal spray Place 2 sprays into both nostrils at bedtime as needed for allergies or rhinitis.   Yes [provider]  gabapentin (NEURONTIN) 100 MG capsule Take 100 mg by mouth at bedtime.   Yes [provider]  ibuprofen (ADVIL,MOTRIN) 600 MG tablet Take 600 mg by mouth every 8 (eight) hours as needed for moderate pain.   Yes [provider]  Infant Care Products  [facility]) OINT Apply 1 application topically See admin instructions. Apply to buttocks every shift for protection   Yes [provider]  losartan (COZAAR) 25 MG tablet Take 25 mg by mouth daily.   Yes [provider]  Menthol, Topical Analgesic, (BIOFREEZE) 4 % GEL Apply 1 application topically 2 (two) times daily as needed (knee pain).   Yes [provider]  metFORMIN (GLUCOPHAGE) 500 MG tablet Take 500 mg by mouth daily.   Yes [provider]  Ostomy Supplies (SKIN PREP WIPES) MISC Apply 1 application topically See admin instructions. Apply to bilateral heels twice  daily for protection   Yes [provider]  sertraline (ZOLOFT) 100 MG tablet Take 1 tablet (100 mg total) by mouth daily. 01/06/14  Yes Josetta Huddle, MD  vitamin B-12 (CYANOCOBALAMIN) 500 MCG tablet Take 500 mcg by mouth daily.   Yes [provider]  aspirin EC 325 MG EC tablet Take 1 tablet (325 mg total) by mouth daily. Patient not taking: No sig reported 01/06/14   Josetta Huddle, MD  cetirizine (ZYRTEC) 10 MG tablet Take 10 mg by mouth daily.    [provider]    Allergies    Codeine, Hctz [hydrochlorothiazide], Iodine, Iohexol, Mirabegron, Prednisone, Tositumomab, and Tramadol  Review of Systems   Review of Systems  Cardiovascular:  Positive for chest pain.  Genitourinary:  Positive for dysuria.  All other systems reviewed and are negative.  Physical Exam Updated Vital Signs BP 120/68   Pulse 88   Temp 98.2 F (36.8 C) (Oral)   Resp (!) 21   SpO2 92%   Physical Exam Vitals and nursing note reviewed.  Constitutional:      Appearance: She is well-developed.  HENT:     Head: Normocephalic and atraumatic.  Cardiovascular:     Rate and Rhythm: Regular rhythm. Tachycardia present.  Pulmonary:     Effort: No respiratory distress.     Breath sounds: No stridor.  Chest:     Chest wall: No mass, deformity or tenderness.  Abdominal:     General: There  is no distension.     Palpations: Abdomen is soft.  Musculoskeletal:     Cervical back: Normal range of motion.     Right lower leg: No edema.     Left lower leg: No edema.  Skin:    General: Skin is warm and dry.  Neurological:     Mental Status: She is alert.    ED Results / Procedures / Treatments   Labs (all labs ordered are listed, but only abnormal results are displayed) Labs Reviewed  COMPREHENSIVE METABOLIC PANEL -  Abnormal; Notable for the following components:      Result Value   Sodium 129 (*)    Glucose, Bld 244 (*)    Calcium 8.3 (*)    Albumin 2.8 (*)    AST 13 (*)    All other components within normal limits  CBC - Abnormal; Notable for the following components:   WBC 13.3 (*)    RBC 3.82 (*)    Hemoglobin 10.6 (*)    HCT 34.4 (*)    RDW 15.6 (*)    All other components within normal limits  D-DIMER, QUANTITATIVE - Abnormal; Notable for the following components:   D-Dimer, Quant 3.15 (*)    All other components within normal limits  CBG MONITORING, ED - Abnormal; Notable for the following components:   Glucose-Capillary 257 (*)    All other components within normal limits  URINALYSIS, ROUTINE W REFLEX MICROSCOPIC  TROPONIN I (HIGH SENSITIVITY)  TROPONIN I (HIGH SENSITIVITY)    EKG EKG Interpretation  Date/Time:  Thursday August 17 2021 03:50:27 EST Ventricular Rate:  91 PR Interval:  184 QRS Duration: 95 QT Interval:  368 QTC Calculation: 453 R Axis:   22 Text Interpretation: Sinus rhythm Minimal ST elevation, inferior leads Confirmed by Merrily Pew [phone]) on 08/17/2021 6:35:23 AM  Radiology DG Chest 2 View  Result Date: 08/17/2021 CLINICAL DATA:  Chest pain. EXAM: CHEST - 2 VIEW COMPARISON:  06/07/2018. FINDINGS: The heart is enlarged and the mediastinal structures are within normal limits. The lungs are clear without effusions or infiltrates. No pneumothorax is seen. No acute osseous abnormality is identified. Surgical clips are present  in the cervical soft tissues on the left. IMPRESSION: Cardiomegaly with no acute process. Electronically Signed   By: Brett Fairy M.D.   On: 08/17/2021 04:50    Procedures Procedures   Medications Ordered in ED Medications  hydrocortisone sodium succinate (SOLU-CORTEF) injection 200 mg (has no administration in time range)  diphenhydrAMINE (BENADRYL) capsule 50 mg (has no administration in time range)    Or  diphenhydrAMINE (BENADRYL) injection 50 mg (has no administration in time range)  ondansetron (ZOFRAN) injection 4 mg (has no administration in time range)  aspirin chewable tablet 324 mg (324 mg Oral Given 08/17/21 0433)    ED Course  I have reviewed the triage vital signs and the nursing notes.  Pertinent labs & imaging results that were available during my care of the patient were reviewed by me and considered in my medical decision making (see chart for details).  Clinical Course as of 08/17/21 2307  Thu Aug 17, 2021  0703 CP r/o, dysuira, pending CTPE with premed protocol [MK]  0706 DNR [MK]    Clinical Course User Index [MK] Kommor, Debe Coder, MD   MDM Rules/Calculators/A&P                         Patient is 87 years old.  Has a history of coronary calcification by cannot see where she has any history of coronary artery disease otherwise.  She last had a Myoview done a few years ago which was negative however nothing recently. Ecg with minimal STE in inferior leads which could be lead placement but wasn't there previously.    Also check a D-dimer to evaluate for PE as she does have chest pain, tachycardia and her oxygen is on the low side.  First troponin is negative.  X-ray is clear.  It does appear that her D-dimer is  pretty elevated even relative to her age.  Will need pretreatment prior to CT scan.  Pending same. Still cp free. Will repeat ecg.   Care transferred pending UA and CTA.   Final Clinical Impression(s) / ED Diagnoses Final diagnoses:  None    Rx /  DC Orders ED Discharge Orders     None        Xzaviar Maloof, Corene Cornea, MD 08/17/21 2307

## 2021-08-17 NOTE — ED Notes (Signed)
[facility] transport canceled  

## 2021-08-18 DIAGNOSIS — E1140 Type 2 diabetes mellitus with diabetic neuropathy, unspecified: Secondary | ICD-10-CM | POA: Diagnosis not present

## 2021-08-18 DIAGNOSIS — R279 Unspecified lack of coordination: Secondary | ICD-10-CM | POA: Diagnosis not present

## 2021-08-18 DIAGNOSIS — M6281 Muscle weakness (generalized): Secondary | ICD-10-CM | POA: Diagnosis not present

## 2021-08-18 DIAGNOSIS — R2681 Unsteadiness on feet: Secondary | ICD-10-CM | POA: Diagnosis not present

## 2021-08-18 DIAGNOSIS — I10 Essential (primary) hypertension: Secondary | ICD-10-CM | POA: Diagnosis not present

## 2021-08-18 DIAGNOSIS — M170 Bilateral primary osteoarthritis of knee: Secondary | ICD-10-CM | POA: Diagnosis not present

## 2021-08-21 DIAGNOSIS — R918 Other nonspecific abnormal finding of lung field: Secondary | ICD-10-CM | POA: Diagnosis not present

## 2021-08-21 DIAGNOSIS — R051 Acute cough: Secondary | ICD-10-CM | POA: Diagnosis not present

## 2021-08-21 DIAGNOSIS — M6281 Muscle weakness (generalized): Secondary | ICD-10-CM | POA: Diagnosis not present

## 2021-08-21 DIAGNOSIS — M170 Bilateral primary osteoarthritis of knee: Secondary | ICD-10-CM | POA: Diagnosis not present

## 2021-08-21 DIAGNOSIS — I10 Essential (primary) hypertension: Secondary | ICD-10-CM | POA: Diagnosis not present

## 2021-08-21 DIAGNOSIS — Z79899 Other long term (current) drug therapy: Secondary | ICD-10-CM | POA: Diagnosis not present

## 2021-08-21 DIAGNOSIS — R2681 Unsteadiness on feet: Secondary | ICD-10-CM | POA: Diagnosis not present

## 2021-08-21 DIAGNOSIS — E1140 Type 2 diabetes mellitus with diabetic neuropathy, unspecified: Secondary | ICD-10-CM | POA: Diagnosis not present

## 2021-08-21 DIAGNOSIS — J90 Pleural effusion, not elsewhere classified: Secondary | ICD-10-CM | POA: Diagnosis not present

## 2021-08-21 DIAGNOSIS — R059 Cough, unspecified: Secondary | ICD-10-CM | POA: Diagnosis not present

## 2021-08-21 DIAGNOSIS — R0781 Pleurodynia: Secondary | ICD-10-CM | POA: Diagnosis not present

## 2021-08-21 DIAGNOSIS — R279 Unspecified lack of coordination: Secondary | ICD-10-CM | POA: Diagnosis not present

## 2021-08-22 DIAGNOSIS — R279 Unspecified lack of coordination: Secondary | ICD-10-CM | POA: Diagnosis not present

## 2021-08-22 DIAGNOSIS — M170 Bilateral primary osteoarthritis of knee: Secondary | ICD-10-CM | POA: Diagnosis not present

## 2021-08-22 DIAGNOSIS — R2681 Unsteadiness on feet: Secondary | ICD-10-CM | POA: Diagnosis not present

## 2021-08-22 DIAGNOSIS — Z79899 Other long term (current) drug therapy: Secondary | ICD-10-CM | POA: Diagnosis not present

## 2021-08-22 DIAGNOSIS — I10 Essential (primary) hypertension: Secondary | ICD-10-CM | POA: Diagnosis not present

## 2021-08-22 DIAGNOSIS — M6281 Muscle weakness (generalized): Secondary | ICD-10-CM | POA: Diagnosis not present

## 2021-08-22 DIAGNOSIS — E1140 Type 2 diabetes mellitus with diabetic neuropathy, unspecified: Secondary | ICD-10-CM | POA: Diagnosis not present

## 2022-03-05 IMAGING — CR DG CHEST 2V
2 series · 2 of 2 positions shown · non-contrast
Comparison: 06/07/2018.

CLINICAL DATA: Chest pain.

EXAM:
CHEST - 2 VIEW

[chest lat]
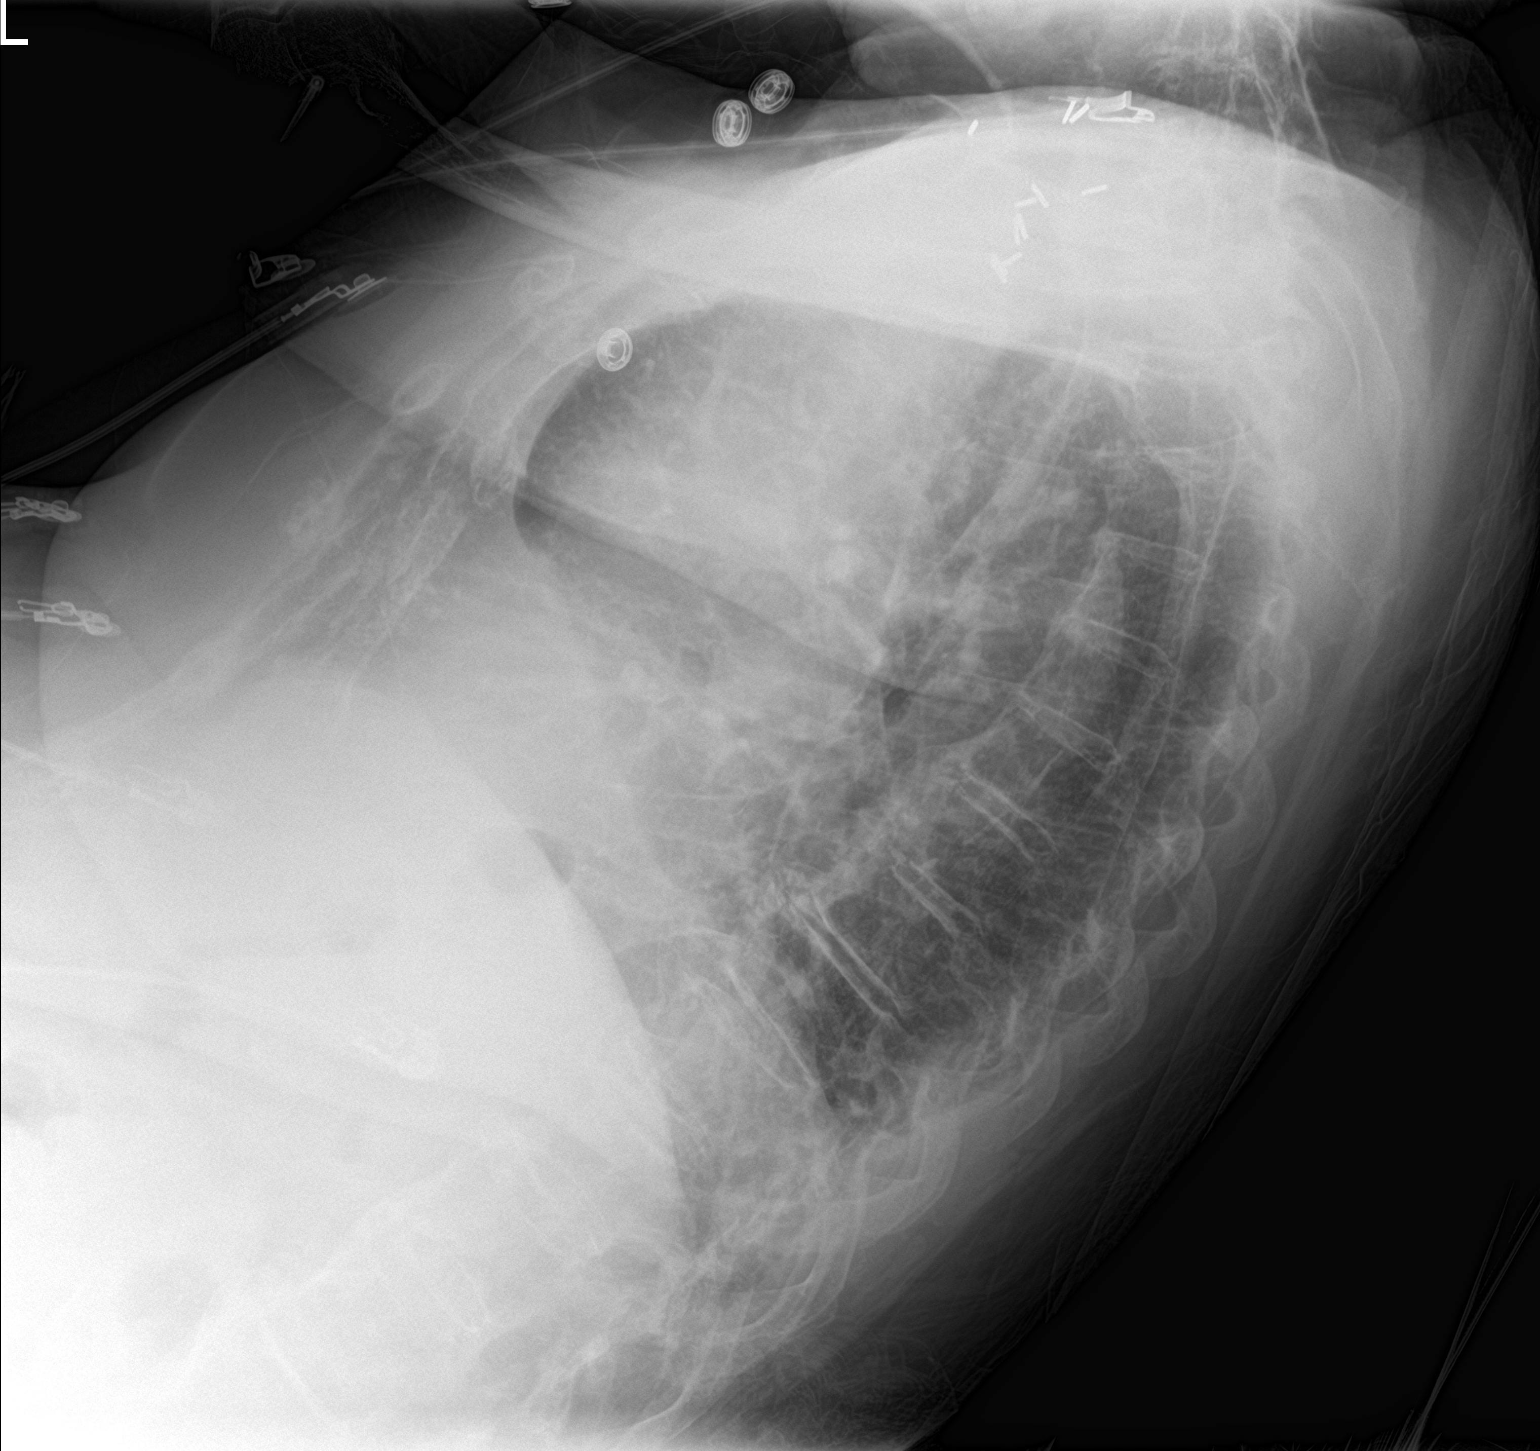

[chest ap]
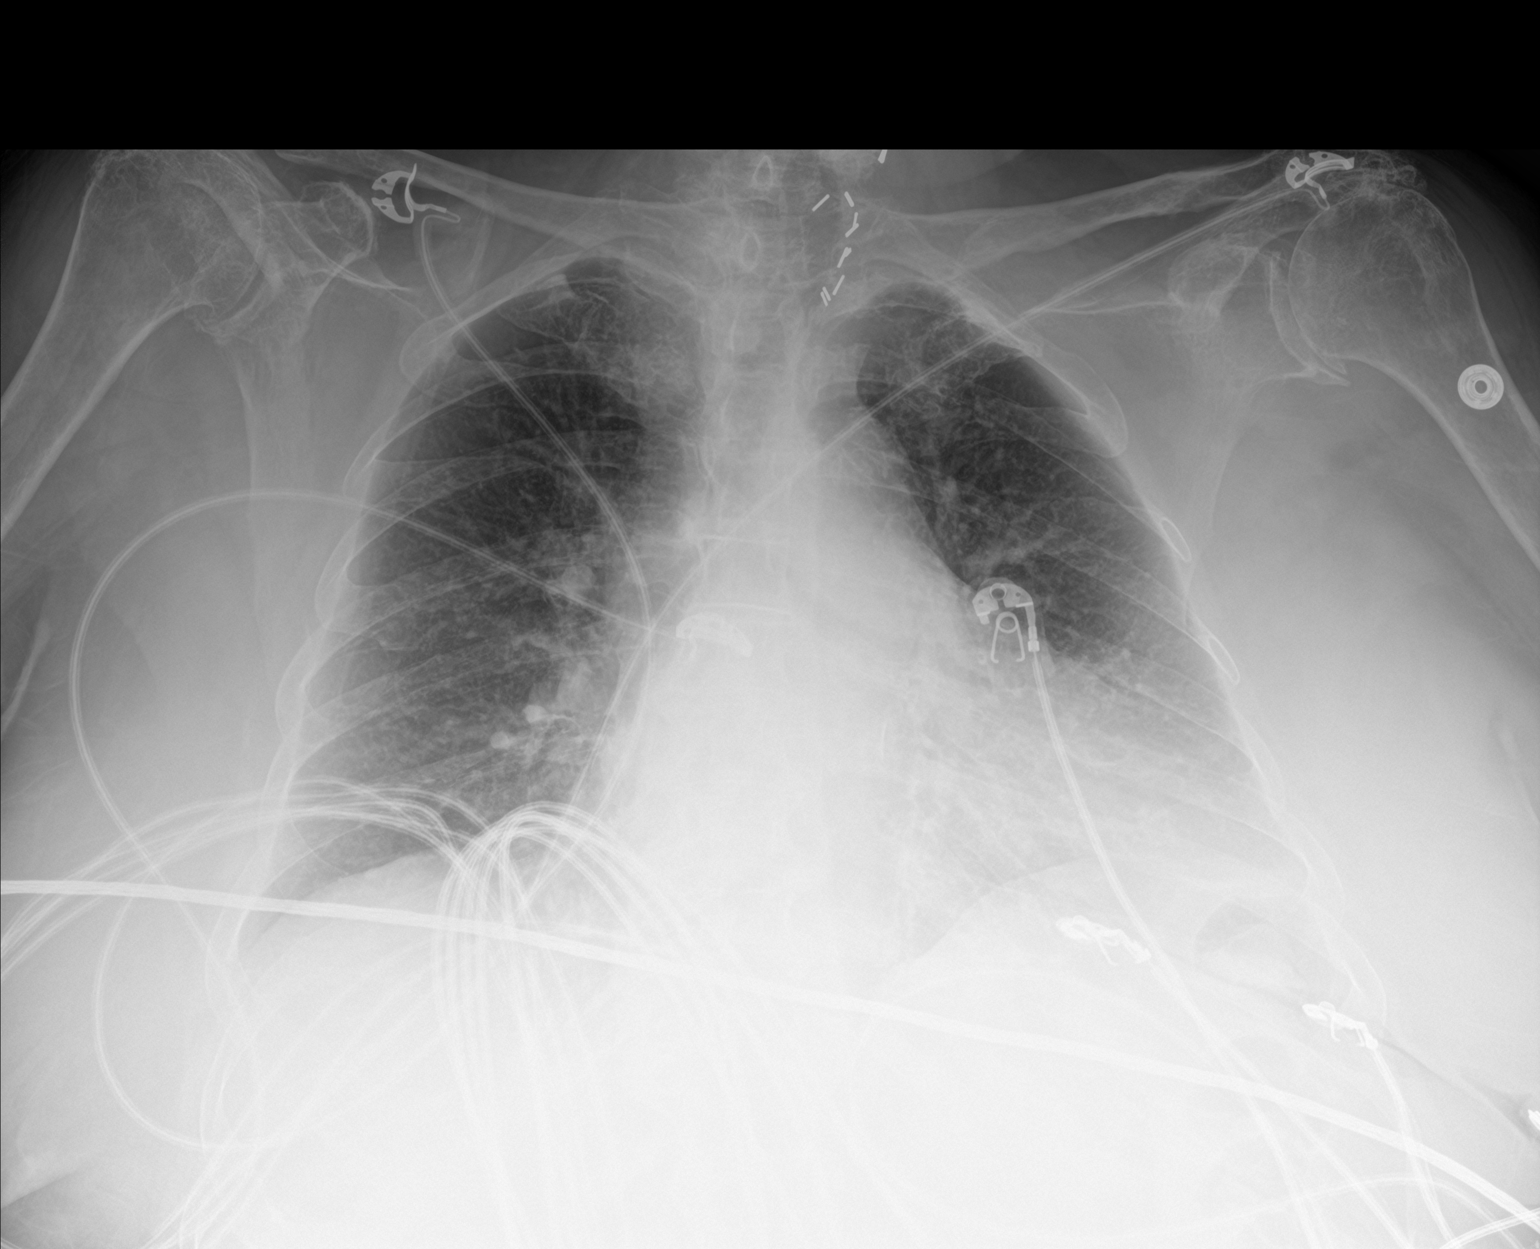

[2 of 2 positions shown; findings below may reference images not displayed]

FINDINGS: The heart is enlarged and the mediastinal structures are within
normal limits. The lungs are clear without effusions or infiltrates.
No pneumothorax is seen. No acute osseous abnormality is identified.
Surgical clips are present in the cervical soft tissues on the left.
IMPRESSION: Cardiomegaly with no acute process.
